# Patient Record
Sex: Female | Born: 1960 | Race: Black or African American | Hispanic: No | Smoking: Never smoker
Health system: Southern US, Community
[De-identification: ages and names within clinical notes are randomized; demographics above are authoritative.]

## PROBLEM LIST (undated history)

## (undated) DIAGNOSIS — J45909 Unspecified asthma, uncomplicated: Secondary | ICD-10-CM

## (undated) DIAGNOSIS — E079 Disorder of thyroid, unspecified: Secondary | ICD-10-CM

## (undated) DIAGNOSIS — R7303 Prediabetes: Secondary | ICD-10-CM

---

## 2015-02-27 ENCOUNTER — Ambulatory Visit
Admission: EM | Admit: 2015-02-27 | Discharge: 2015-02-27 | Disposition: A | Discharge status: Home / Self Care | Payer: Worker's Compensation

## 2015-02-27 NOTE — ED Notes (Signed)
For random drug test-works at [employer]. Does not need to see a Provider-no personal injury

## 2015-02-27 NOTE — ED Notes (Signed)
COC Urine obtained per protocol.

## 2015-09-08 ENCOUNTER — Emergency Department
Admission: EM | Admit: 2015-09-08 | Discharge: 2015-09-08 | Disposition: A | Discharge status: Home / Self Care | Payer: 59 | Attending: Emergency Medicine | Admitting: Emergency Medicine

## 2015-09-08 ENCOUNTER — Encounter: Payer: Self-pay | Admitting: Emergency Medicine

## 2015-09-08 ENCOUNTER — Emergency Department: Payer: 59

## 2015-09-08 DIAGNOSIS — R112 Nausea with vomiting, unspecified: Secondary | ICD-10-CM | POA: Insufficient documentation

## 2015-09-08 DIAGNOSIS — R51 Headache: Secondary | ICD-10-CM | POA: Insufficient documentation

## 2015-09-08 DIAGNOSIS — R519 Headache, unspecified: Secondary | ICD-10-CM

## 2015-09-08 DIAGNOSIS — J45909 Unspecified asthma, uncomplicated: Secondary | ICD-10-CM | POA: Insufficient documentation

## 2015-09-08 HISTORY — DX: Disorder of thyroid, unspecified: E07.9

## 2015-09-08 HISTORY — DX: Prediabetes: R73.03

## 2015-09-08 HISTORY — DX: Unspecified asthma, uncomplicated: J45.909

## 2015-09-08 LAB — CBC WITH DIFFERENTIAL/PLATELET
BASOS PCT: 1 %
Basophils Absolute: 0 10*3/uL (ref 0–0.1)
Eosinophils Absolute: 0.1 10*3/uL (ref 0–0.7)
Eosinophils Relative: 2 %
HEMATOCRIT: 37.1 % (ref 35.0–47.0)
HEMOGLOBIN: 11.7 g/dL — AB (ref 12.0–16.0)
LYMPHS ABS: 1.6 10*3/uL (ref 1.0–3.6)
Lymphocytes Relative: 28 %
MCH: 22.1 pg — AB (ref 26.0–34.0)
MCHC: 31.5 g/dL — AB (ref 32.0–36.0)
MCV: 70 fL — ABNORMAL LOW (ref 80.0–100.0)
MONOS PCT: 11 %
Monocytes Absolute: 0.6 10*3/uL (ref 0.2–0.9)
NEUTROS ABS: 3.3 10*3/uL (ref 1.4–6.5)
NEUTROS PCT: 58 %
Platelets: 209 10*3/uL (ref 150–440)
RBC: 5.29 MIL/uL — AB (ref 3.80–5.20)
RDW: 15.1 % — ABNORMAL HIGH (ref 11.5–14.5)
WBC: 5.7 10*3/uL (ref 3.6–11.0)

## 2015-09-08 LAB — BASIC METABOLIC PANEL
Anion gap: 4 — ABNORMAL LOW (ref 5–15)
BUN: 13 mg/dL (ref 6–20)
CHLORIDE: 107 mmol/L (ref 101–111)
CO2: 30 mmol/L (ref 22–32)
CREATININE: 0.86 mg/dL (ref 0.44–1.00)
Calcium: 9.1 mg/dL (ref 8.9–10.3)
GFR calc non Af Amer: >60 mL/min (ref >60)
Glucose, Bld: 102 mg/dL — ABNORMAL HIGH (ref 65–99)
POTASSIUM: 4.3 mmol/L (ref 3.5–5.1)
SODIUM: 141 mmol/L (ref 135–145)

## 2015-09-08 LAB — SEDIMENTATION RATE: Sed Rate: 23 mm/hr (ref 0–30)

## 2015-09-08 MED ORDER — BUTALBITAL-APAP-CAFFEINE 50-325-40 MG PO TABS: 1.0000 | ORAL_TABLET | Freq: Four times a day (QID) | ORAL | 0 refills | Status: AC | PRN

## 2015-09-08 NOTE — ED Notes (Signed)

## 2015-09-08 NOTE — ED Triage Notes (Signed)
Patient from home via POV with hx migraines. Presents today with headache across her entire head. +photosensitivity, +N/V

## 2015-09-08 NOTE — ED Provider Notes (Signed)
[facility] Emergency Department Provider Note   ____________________________________________   First MD Initiated Contact with Patient 09/08/15 1006     (approximate)  I have reviewed the triage vital signs and the nursing notes.   HISTORY  Chief Complaint Migraine   HPI Beverly Huber is a 55 y.o. female with a history of migraine headache who is presenting to the emergency department todaywith a diffuse headache which is a 5 out of 10 at this time. She says that she has had migraines in the past but not since about 10 years ago. Says that about a month ago she had a similar headache and had to lie down at work but the headache went away after resting. She also says that she had sensitivity to light this morning as well as nausea and vomiting. She took 2 Goody powders as well as Reglan and she says her pain is greatly improved. She says that at the beginning of her headache during the night her pain was a 3 out of 10 which increased then to an 8-9 out of 10. There was no sudden onset or thunderclap quality to this headache.  Patient is concerned because she says her brother had a "blood clot" in his brain. Past Medical History:  Diagnosis Date  . Asthma   . Prediabetes   . Thyroid disease     There are no active problems to display for this patient.   History reviewed. No pertinent surgical history.  Prior to Admission medications   Not on File    Allergies Review of patient's allergies indicates no known allergies.  History reviewed. No pertinent family history.  Social History Social History  Substance Use Topics  . Smoking status: Never Smoker  . Smokeless tobacco: Never Used  . Alcohol use Not on file    Review of Systems Constitutional: No fever/chills Eyes: No visual changes. ENT: No sore throat. Cardiovascular: Denies chest pain. Respiratory: Denies shortness of breath. Gastrointestinal: No abdominal pain.   No diarrhea.  No  constipation. Genitourinary: Negative for dysuria. Musculoskeletal: Negative for back pain. Skin: Negative for rash. Neurological: Negative for focal weakness or numbness.  10-point ROS otherwise negative.  ____________________________________________   PHYSICAL EXAM:  VITAL SIGNS: ED Triage Vitals  Enc Vitals Group     BP 09/08/15 0950 (!) 135/101     Pulse Rate 09/08/15 0950 64     Resp 09/08/15 0950 18     Temp 09/08/15 0950 97.8 F (36.6 C)     Temp Source 09/08/15 0950 Oral     SpO2 09/08/15 0950 100 %     Weight 09/08/15 0951 196 lb (88.9 kg)     Height 09/08/15 0951 '5\' 3"'$  (1.6 m)     Head Circumference --      Peak Flow --      Pain Score 09/08/15 0951 7     Pain Loc --      Pain Edu? --      Excl. in [place]? --     Constitutional: Alert and oriented. Well appearing and in no acute distress. Eyes: Conjunctivae are normal. PERRL. EOMI. Head: Atraumatic. Nose: No congestion/rhinnorhea. Mouth/Throat: Mucous membranes are moist.  Neck: No stridor.   Cardiovascular: Normal rate, regular rhythm. Grossly normal heart sounds.   Respiratory: Normal respiratory effort.  No retractions. Lungs CTAB. Gastrointestinal: Soft and nontender. No distention.  Musculoskeletal: No lower extremity tenderness nor edema.  No joint effusions. Neurologic:  Normal speech and language. No gross  focal neurologic deficits are appreciated. Tenderness diffusely to the patient's head including of the temples but without any nodularity along the distributions of the temporal arteries. Skin:  Skin is warm, dry and intact. No rash noted. Psychiatric: Mood and affect are normal. Speech and behavior are normal.  ____________________________________________   LABS (all labs ordered are listed, but only abnormal results are displayed)  Labs Reviewed  CBC WITH DIFFERENTIAL/PLATELET - Abnormal; Notable for the following:       Result Value   RBC 5.29 (*)    Hemoglobin 11.7 (*)    MCV 70.0 (*)     MCH 22.1 (*)    MCHC 31.5 (*)    RDW 15.1 (*)    All other components within normal limits  BASIC METABOLIC PANEL - Abnormal; Notable for the following:    Glucose, Bld 102 (*)    Anion gap 4 (*)    All other components within normal limits  SEDIMENTATION RATE   ____________________________________________  EKG   ____________________________________________  RADIOLOGY  CT Head Wo Contrast (Accession [id number]) (Order [id number])  Imaging  Date: 09/08/2015 Department: [facility] EMERGENCY DEPARTMENT Released By/Authorizing: Orbie Pyo, MD (auto-released)  PACS Images   Show images for CT Head Wo Contrast  Study Result   CLINICAL DATA:  Migraine headaches today.  Nausea and vomiting.  EXAM: CT HEAD WITHOUT CONTRAST  TECHNIQUE: Contiguous axial images were obtained from the base of the skull through the vertex without intravenous contrast.  COMPARISON:  None.  FINDINGS: Brain: There is no midline shift, hydrocephalus, or mass. No acute hemorrhage or acute transcortical infarct is identified.  Skull: No acute abnormality identified.  Sinuses/Orbits: The orbits are normal. The visualized sinuses are clear.  Other: None  IMPRESSION: No focal acute intracranial abnormality identified.   Electronically Signed   By: Abelardo Diesel M.D.   On: 09/08/2015 11:22     ____________________________________________   PROCEDURES  Procedure(s) performed:   Procedures  Critical Care performed:   ____________________________________________   INITIAL IMPRESSION / ASSESSMENT AND PLAN / ED COURSE  Pertinent labs & imaging results that were available during my care of the patient were reviewed by me and considered in my medical decision making (see chart for details).  ----------------------------------------- 12:50 PM on 09/08/2015 -----------------------------------------  Very reassuring workup including a  negative CAT scan as well as normal ESR. Patient likely with tension headache versus migraine. We'll discharge her home with Fioricet. To follow-up with her family doctor in [place].  Clinical Course     ____________________________________________   FINAL CLINICAL IMPRESSION(S) / ED DIAGNOSES  Headache.    NEW MEDICATIONS STARTED DURING THIS VISIT:  New Prescriptions   No medications on file     Note:  This document was prepared using Dragon voice recognition software and may include unintentional dictation errors.    Orbie Pyo, MD 09/08/15 1250

## 2015-11-14 ENCOUNTER — Encounter: Payer: Self-pay | Admitting: *Deleted

## 2015-11-14 ENCOUNTER — Emergency Department
Admission: EM | Admit: 2015-11-14 | Discharge: 2015-11-14 | Disposition: A | Discharge status: Home / Self Care | Payer: 59 | Attending: Emergency Medicine | Admitting: Emergency Medicine

## 2015-11-14 ENCOUNTER — Emergency Department: Payer: 59

## 2015-11-14 DIAGNOSIS — J45909 Unspecified asthma, uncomplicated: Secondary | ICD-10-CM | POA: Insufficient documentation

## 2015-11-14 DIAGNOSIS — X501XXA Overexertion from prolonged static or awkward postures, initial encounter: Secondary | ICD-10-CM | POA: Insufficient documentation

## 2015-11-14 DIAGNOSIS — Y999 Unspecified external cause status: Secondary | ICD-10-CM | POA: Diagnosis not present

## 2015-11-14 DIAGNOSIS — S93401A Sprain of unspecified ligament of right ankle, initial encounter: Secondary | ICD-10-CM | POA: Insufficient documentation

## 2015-11-14 DIAGNOSIS — Y939 Activity, unspecified: Secondary | ICD-10-CM | POA: Insufficient documentation

## 2015-11-14 DIAGNOSIS — Y92480 Sidewalk as the place of occurrence of the external cause: Secondary | ICD-10-CM | POA: Insufficient documentation

## 2015-11-14 DIAGNOSIS — S99911A Unspecified injury of right ankle, initial encounter: Secondary | ICD-10-CM | POA: Diagnosis present

## 2015-11-14 MED ORDER — IBUPROFEN 800 MG PO TABS: 800.0000 mg | ORAL_TABLET | Freq: Three times a day (TID) | ORAL | 0 refills | Status: DC | PRN

## 2015-11-14 MED ORDER — ACETAMINOPHEN 500 MG PO TABS
1000.0000 mg | ORAL_TABLET | Freq: Once | ORAL | Status: AC
  Administered 2015-11-14: 1000 mg via ORAL
  Filled 2015-11-14: qty 2

## 2015-11-14 NOTE — Discharge Instructions (Signed)
Please follow the RICE instructions to decrease pain and speed up healing. You may take Tylenol and Motrin for pain.  Return to the emergency department if you develop severe pain, numbness tingling or weakness, or any other symptoms concerning to you.

## 2015-11-14 NOTE — ED Provider Notes (Signed)
[facility] Emergency Department Provider Note  ____________________________________________  Time seen: Approximately 7:42 PM  I have reviewed the triage vital signs and the nursing notes.   HISTORY  Chief Complaint Ankle Pain    HPI Rodman KeyJanice Vitullo is a 55 y.o. female with obesity presenting for right ankle pain. The patient reports that she heard at her right ankle on the edge of a sidewalk and has been having pain since then. She did not fall, has no pain in the right knee or hip, and has been able to ambulate on the foot. No numbness or tingling.   Past Medical History:  Diagnosis Date  . Asthma   . Prediabetes   . Thyroid disease     There are no active problems to display for this patient.   History reviewed. No pertinent surgical history.  Current Outpatient Rx  . Order #: [id number] Class: Print  . Order #: [id number] Class: Print    Allergies Review of patient's allergies indicates no known allergies.  History reviewed. No pertinent family history.  Social History Social History  Substance Use Topics  . Smoking status: Never Smoker  . Smokeless tobacco: Never Used  . Alcohol use Not on file    Review of Systems Constitutional: No fever/chills.No fall. No loss of consciousness. Eyes: No visual changes. ENT: No sore throat. No congestion or rhinorrhea. Cardiovascular: Denies chest pain. Denies palpitations. Respiratory: Denies shortness of breath.  No cough. Gastrointestinal: No nausea, no vomiting.   Musculoskeletal: Negative for back pain. Positive for right ankle pain. Skin: Negative for rash. Neurological: Negative for headaches. No focal numbness, tingling or weakness.   10-point ROS otherwise negative.  ____________________________________________   PHYSICAL EXAM:  VITAL SIGNS: ED Triage Vitals [11/14/15 1741]  Enc Vitals Group     BP (!) 135/93     Pulse Rate 69     Resp 18     Temp 98.6 F (37 C)     Temp  Source Oral     SpO2 99 %     Weight 200 lb (90.7 kg)     Height 5\' 3"  (1.6 m)     Head Circumference      Peak Flow      Pain Score 5     Pain Loc      Pain Edu?      Excl. in [place]?     Constitutional: Alert and oriented. Well appearing and in no acute distress. Answers questions appropriately. Eyes: Conjunctivae are normal.  EOMI. No scleral icterus. Head: Atraumatic. Nose: No congestion/rhinnorhea. Mouth/Throat: Mucous membranes are moist.  Neck: No stridor.  Supple.   Cardiovascular: Normal rate Respiratory: Normal respiratory effort.  Musculoskeletal: Full range of motion of the right hip and knee without pain. No knee effusion. Right ankle has tenderness to palpation above and below the lateral malleolus with mild soft tissue swelling but no ecchymosis or skin break. Normal DP and PT pulses on the right. Normal sensation to light touch on the right. 5 out of 5 dorsiflexion and plantar flexion on the right. Neurologic:  A&Ox3.  Speech is clear.  Face and smile are symmetric.  EOMI.  Moves all extremities well. Skin:  Skin is warm, dry and intact. No rash noted. Psychiatric: Mood and affect are normal. Speech and behavior are normal.  Normal judgement.  ____________________________________________   LABS (all labs ordered are listed, but only abnormal results are displayed)  Labs Reviewed - No data to display ____________________________________________  EKG  Not  indicated ____________________________________________  RADIOLOGY  Dg Ankle Complete Right  Result Date: 11/14/2015 CLINICAL DATA:  Initial evaluation for acute injury, twisted ankle. EXAM: RIGHT ANKLE - COMPLETE 3+ VIEW COMPARISON:  None. FINDINGS: No acute fracture or dislocation. Ankle mortise approximated. Talar dome intact. Small posterior plantar calcaneal enthesophytes noted. Osseous mineralization normal. Mild soft tissue swelling at the right lateral malleolus. IMPRESSION: 1. No acute fracture or  dislocation. 2. Mild focal soft tissue swelling at the lateral malleolus. Electronically Signed   By: Rise MuBenjamin  McClintock M.D.   On: 11/14/2015 19:26    ____________________________________________   PROCEDURES  Procedure(s) performed: None  Procedures  Critical Care performed: No ____________________________________________   INITIAL IMPRESSION / ASSESSMENT AND PLAN / ED COURSE  Pertinent labs & imaging results that were available during my care of the patient were reviewed by me and considered in my medical decision making (see chart for details).  55 y.o. female status post eversion of the right ankle with lateral malleolar pain. Will rule out fracture, but I am more suspicious of a sprain given that she is able to relate on the ankle.  ----------------------------------------- 7:45 PM on 11/14/2015 -----------------------------------------  X-ray was negative for any osseous injury. I will send the patient home with instructions for rice, and pain control. Return precautions as well as follow-up instructions were discussed.  ____________________________________________  FINAL CLINICAL IMPRESSION(S) / ED DIAGNOSES  Final diagnoses:  Sprain of right ankle, unspecified ligament, initial encounter    Clinical Course      NEW MEDICATIONS STARTED DURING THIS VISIT:  New Prescriptions   IBUPROFEN (ADVIL,MOTRIN) 800 MG TABLET    Take 1 tablet (800 mg total) by mouth every 8 (eight) hours as needed.      Rockne MenghiniAnne-Caroline Kallon Caylor, MD 11/14/15 1946

## 2015-11-14 NOTE — ED Triage Notes (Signed)
States she twisted off her sidewalk wrong and is now having right ankle pain

## 2016-07-30 ENCOUNTER — Emergency Department
Admission: EM | Admit: 2016-07-30 | Discharge: 2016-07-30 | Disposition: A | Discharge status: Home / Self Care | Payer: 59 | Attending: Emergency Medicine | Admitting: Emergency Medicine

## 2016-07-30 ENCOUNTER — Emergency Department: Payer: 59

## 2016-07-30 DIAGNOSIS — R05 Cough: Secondary | ICD-10-CM | POA: Diagnosis present

## 2016-07-30 DIAGNOSIS — J45901 Unspecified asthma with (acute) exacerbation: Secondary | ICD-10-CM | POA: Diagnosis not present

## 2016-07-30 MED ORDER — ALBUTEROL SULFATE HFA 108 (90 BASE) MCG/ACT IN AERS: 2.0000 | INHALATION_SPRAY | Freq: Four times a day (QID) | RESPIRATORY_TRACT | 2 refills | Status: AC | PRN

## 2016-07-30 MED ORDER — IPRATROPIUM-ALBUTEROL 0.5-2.5 (3) MG/3ML IN SOLN
3.0000 mL | Freq: Once | RESPIRATORY_TRACT | Status: AC
  Administered 2016-07-30: 3 mL via RESPIRATORY_TRACT
  Filled 2016-07-30: qty 3

## 2016-07-30 NOTE — ED Triage Notes (Signed)
Pt reports having some asthma exacerbation along with a cough recently.  Pt reports being out of her rescue inhaler at this time and has been unable to get in with her PCP.  Pt is in NAD, ambulatory to triage, A&Ox4.

## 2016-07-30 NOTE — ED Provider Notes (Signed)
[facility] Emergency Department Provider Note  ____________________________________________  Time seen: Approximately 7:26 PM  I have reviewed the triage vital signs and the nursing notes.   HISTORY  Chief Complaint Asthma   HPI Beverly Huber is a 56 y.o. female who presents to the emergency department for evaluation of asthma. She is out of her albuterol inhaler. She feel that her chest is tight and has a cough that started on Monday. She denies fever or other symptoms of concern.    Past Medical History:  Diagnosis Date  . Asthma   . Prediabetes   . Thyroid disease     There are no active problems to display for this patient.   History reviewed. No pertinent surgical history.  Prior to Admission medications   Medication Sig Start Date End Date Taking? Authorizing Provider  albuterol (PROVENTIL HFA;VENTOLIN HFA) 108 (90 Base) MCG/ACT inhaler Inhale 2 puffs into the lungs every 6 (six) hours as needed for wheezing or shortness of breath. 07/30/16   Chinita Pester, FNP  butalbital-acetaminophen-caffeine (FIORICET) [phone] MG tablet Take 1-2 tablets by mouth every 6 (six) hours as needed for headache. 09/08/15 09/07/16  Myrna Blazer, MD  ibuprofen (ADVIL,MOTRIN) 800 MG tablet Take 1 tablet (800 mg total) by mouth every 8 (eight) hours as needed. 11/14/15   Rockne Menghini, MD    Allergies Patient has no known allergies.  No family history on file.  Social History Social History  Substance Use Topics  . Smoking status: Never Smoker  . Smokeless tobacco: Never Used  . Alcohol use Not on file    Review of Systems Constitutional: Negative for fever/chills ENT: Negative for sore throat. Cardiovascular: Denies chest pain. Respiratory: Positive for shortness of breath. Positive for cough. Gastrointestinal: Negative for nausea,  Negative for vomiting.  Negative for diarrhea.  Musculoskeletal: Negataive for body aches Skin:  Negative for rash. Neurological: Negative for headaches ____________________________________________   PHYSICAL EXAM:  VITAL SIGNS: ED Triage Vitals  Enc Vitals Group     BP 07/30/16 1835 (!) 146/98     Pulse Rate 07/30/16 1835 89     Resp 07/30/16 1835 18     Temp 07/30/16 1835 99.6 F (37.6 C)     Temp Source 07/30/16 1835 Oral     SpO2 07/30/16 1835 100 %     Weight 07/30/16 1836 200 lb (90.7 kg)     Height 07/30/16 1836 5' 3.5" (1.613 m)     Head Circumference --      Peak Flow --      Pain Score --      Pain Loc --      Pain Edu? --      Excl. in [place]? --     Constitutional: Alert and oriented. Well appearing and in no acute distress. Eyes: Conjunctivae are normal. EOMI. Nose: No congestion noted; no rhinnorhea. Mouth/Throat: Mucous membranes are moist.  Oropharynx normal. Tonsils without exudate. Neck: No stridor.  Lymphatic: No cervical lymphadenopathy. Cardiovascular: Normal rate, regular rhythm. Good peripheral circulation. Respiratory: Normal respiratory effort.  No retractions. Breath sounds diminished throughout. Gastrointestinal: Soft and nontender.  Musculoskeletal: FROM x 4 extremities.  Neurologic:  Normal speech and language.  Skin:  Skin is warm, dry and intact. No rash noted. Psychiatric: Mood and affect are normal. Speech and behavior are normal.  ____________________________________________   LABS (all labs ordered are listed, but only abnormal results are displayed)  Labs Reviewed - No data to display ____________________________________________  EKG  Not indicated. ____________________________________________  RADIOLOGY  Chest x-ray negative for acute cardiopulmonary abnormality per radiology. ____________________________________________   PROCEDURES  Procedure(s) performed: None  Critical Care performed: No ____________________________________________   INITIAL IMPRESSION / ASSESSMENT AND PLAN / ED COURSE  56 year old female  presenting to the emergency department for treatment and evaluation of cough. She has been out of her albuterol for several weeks. Monday, she developed a scratchy throat and has since developed a cough that has not been relieved with over-the-counter medications. She has been unable to schedule an appointment with her primary care provider for refill of her albuterol. Tonight, she was given a DuoNeb treatment. She will be given a prescription for albuterol and encouraged to follow up with the primary care provider for symptoms that are not improving over the next few days. She was encouraged to return to the emergency department for symptoms that change or worsen if she is unable to schedule an appointment.  Pertinent labs & imaging results that were available during my care of the patient were reviewed by me and considered in my medical decision making (see chart for details).  New Prescriptions   ALBUTEROL (PROVENTIL HFA;VENTOLIN HFA) 108 (90 BASE) MCG/ACT INHALER    Inhale 2 puffs into the lungs every 6 (six) hours as needed for wheezing or shortness of breath.    If controlled substance prescribed during this visit, 12 month history viewed on the [facility] prior to issuing an initial prescription for Schedule II or III opiod. ____________________________________________   FINAL CLINICAL IMPRESSION(S) / ED DIAGNOSES  Final diagnoses:  Mild asthma with exacerbation, unspecified whether persistent    Note:  This document was prepared using Dragon voice recognition software and may include unintentional dictation errors.     Chinita Pesterriplett, Adisson Deak B, FNP 07/30/16 2025    Minna AntisPaduchowski, Kevin, MD 07/30/16 2251

## 2016-07-30 NOTE — Discharge Instructions (Signed)
Please follow up with their primary care provider for symptoms that are not improving over the next couple of days. Return to the emergency department for symptoms that change or worsen if you're unable to schedule an appointment.

## 2017-01-17 ENCOUNTER — Emergency Department: Payer: 59

## 2017-01-17 ENCOUNTER — Emergency Department
Admission: EM | Admit: 2017-01-17 | Discharge: 2017-01-17 | Disposition: A | Discharge status: Home / Self Care | Payer: 59 | Attending: Emergency Medicine | Admitting: Emergency Medicine

## 2017-01-17 ENCOUNTER — Other Ambulatory Visit: Payer: Self-pay

## 2017-01-17 DIAGNOSIS — Y999 Unspecified external cause status: Secondary | ICD-10-CM | POA: Insufficient documentation

## 2017-01-17 DIAGNOSIS — S9031XA Contusion of right foot, initial encounter: Secondary | ICD-10-CM

## 2017-01-17 DIAGNOSIS — J45909 Unspecified asthma, uncomplicated: Secondary | ICD-10-CM | POA: Insufficient documentation

## 2017-01-17 DIAGNOSIS — W2203XA Walked into furniture, initial encounter: Secondary | ICD-10-CM | POA: Diagnosis not present

## 2017-01-17 DIAGNOSIS — Y929 Unspecified place or not applicable: Secondary | ICD-10-CM | POA: Insufficient documentation

## 2017-01-17 DIAGNOSIS — Z79899 Other long term (current) drug therapy: Secondary | ICD-10-CM | POA: Diagnosis not present

## 2017-01-17 DIAGNOSIS — S99921A Unspecified injury of right foot, initial encounter: Secondary | ICD-10-CM | POA: Diagnosis present

## 2017-01-17 DIAGNOSIS — Y939 Activity, unspecified: Secondary | ICD-10-CM | POA: Diagnosis not present

## 2017-01-17 MED ORDER — IBUPROFEN 600 MG PO TABS: 600.0000 mg | ORAL_TABLET | Freq: Three times a day (TID) | ORAL | 0 refills | Status: DC | PRN

## 2017-01-17 NOTE — ED Provider Notes (Signed)
[facility] Emergency Department Provider Note  ____________________________________________   First MD Initiated Contact with Patient 01/17/17 1027     (approximate)  I have reviewed the triage vital signs and the nursing notes.   HISTORY  Chief Complaint Foot Injury   HPI Beverly Huber is a 57 y.o. female is here with  complaint of third and fourth digits right foot being painful. Patient states that she kicked at a soft on the floor and actually hit the door frame. This occurred last evening.She has not taken any over-the-counter medication. She denies any other injury.she rates her pain as 3 out of 10.   Past Medical History:  Diagnosis Date  . Asthma   . Prediabetes   . Thyroid disease     There are no active problems to display for this patient.   No past surgical history on file.  Prior to Admission medications   Medication Sig Start Date End Date Taking? Authorizing Provider  albuterol (PROVENTIL HFA;VENTOLIN HFA) 108 (90 Base) MCG/ACT inhaler Inhale 2 puffs into the lungs every 6 (six) hours as needed for wheezing or shortness of breath. 07/30/16   Triplett, Rulon Eisenmengerari B, FNP  ibuprofen (ADVIL,MOTRIN) 600 MG tablet Take 1 tablet (600 mg total) by mouth every 8 (eight) hours as needed. 01/17/17   Tommi RumpsSummers, Lavanda Nevels L, PA-C    Allergies Patient has no known allergies.  No family history on file.  Social History Social History   Tobacco Use  . Smoking status: Never Smoker  . Smokeless tobacco: Never Used  Substance Use Topics  . Alcohol use: Not on file  . Drug use: Not on file    Review of Systems Constitutional: No fever/chills Cardiovascular: Denies chest pain. Respiratory: Denies shortness of breath. Gastrointestinal:  No nausea, no vomiting.  Musculoskeletal: right foot pain. Skin: Negative for skin abrasion. Neurological: Negative for  focal weakness or numbness. ____________________________________________   PHYSICAL  EXAM:  VITAL SIGNS: ED Triage Vitals  Enc Vitals Group     BP 01/17/17 0922 137/84     Pulse Rate 01/17/17 0922 74     Resp 01/17/17 0922 20     Temp 01/17/17 0922 98.3 F (36.8 C)     Temp Source 01/17/17 0922 Oral     SpO2 01/17/17 0922 99 %     Weight 01/17/17 0922 212 lb (96.2 kg)     Height 01/17/17 0922 5' 3.5" (1.613 m)     Head Circumference --      Peak Flow --      Pain Score 01/17/17 0921 3     Pain Loc --      Pain Edu? --      Excl. in [place]? --    Constitutional: Alert and oriented. Well appearing and in no acute distress. Eyes: Conjunctivae are normal.  Head: Atraumatic. Neck: No stridor.   Cardiovascular: Normal rate, regular rhythm. Grossly normal heart sounds.  Good peripheral circulation. Respiratory: Normal respiratory effort.  No retractions. Lungs CTAB. Musculoskeletal: examination of right foot third and fourth digit is extremely tender to touch. No gross deformity is noted. There is some soft tissue swelling present. Motor sensory function intact. Neurologic:  Normal speech and language. No gross focal neurologic deficits are appreciated.  Skin:  Skin is warm, dry and intact. No rash noted. Psychiatric: Mood and affect are normal. Speech and behavior are normal.  ____________________________________________   LABS (all labs ordered are listed, but only abnormal results are displayed)  Labs Reviewed -  No data to display  RADIOLOGY  Dg Foot Complete Right  Result Date: 01/17/2017 CLINICAL DATA:  Patient kicked door frame.  Initial encounter. EXAM: RIGHT FOOT COMPLETE - 3+ VIEW COMPARISON:  Ankle radiograph 11/14/2015 FINDINGS: Normal anatomic alignment. No evidence for acute fracture or dislocation. Posterior calcaneal spurring. Unremarkable soft tissues. IMPRESSION: No acute osseous abnormality. Electronically Signed   By: Annia Belt M.D.   On: 01/17/2017 09:51    ____________________________________________   PROCEDURES  Procedure(s) performed:  None  Procedures  Critical Care performed: No  ____________________________________________   INITIAL IMPRESSION / ASSESSMENT AND PLAN / ED COURSE Patient was reassured that x-ray did not show a fracture. Patient was placed in a wooden shoe and given a prescription for ibuprofen 600 mg every 8 hours as needed for pain. She is aware she needs to continue to ice and elevate to reduce swelling and help with pain. She'll follow-up with Dr. Clide Cliff who is on-call for podiatry if any continued problems.  ____________________________________________   FINAL CLINICAL IMPRESSION(S) / ED DIAGNOSES  Final diagnoses:  Contusion of right foot, initial encounter     ED Discharge Orders        Ordered    ibuprofen (ADVIL,MOTRIN) 600 MG tablet  Every 8 hours PRN     01/17/17 1041       Note:  This document was prepared using Dragon voice recognition software and may include unintentional dictation errors.    Tommi Rumps, PA-C 01/17/17 1316    Governor Rooks, MD 01/17/17 [phone]

## 2017-01-17 NOTE — ED Notes (Signed)
Post op shoe on 

## 2017-01-17 NOTE — ED Triage Notes (Signed)
Pt report that her kids left a sock on the floor in the bathroom and she went to kick it and kicked the door frame. Her third and fourth digit on right foot is swollen.

## 2017-06-10 ENCOUNTER — Other Ambulatory Visit: Payer: Self-pay | Admitting: Family Medicine

## 2017-06-10 DIAGNOSIS — E010 Iodine-deficiency related diffuse (endemic) goiter: Secondary | ICD-10-CM

## 2017-06-16 ENCOUNTER — Other Ambulatory Visit: Payer: Self-pay | Admitting: Family Medicine

## 2017-06-17 ENCOUNTER — Other Ambulatory Visit: Payer: Self-pay | Admitting: Family Medicine

## 2017-06-17 DIAGNOSIS — Z1239 Encounter for other screening for malignant neoplasm of breast: Secondary | ICD-10-CM

## 2017-06-22 ENCOUNTER — Ambulatory Visit
Admission: RE | Admit: 2017-06-22 | Discharge: 2017-06-22 | Disposition: A | Discharge status: Home / Self Care | Payer: 59 | Source: Ambulatory Visit | Attending: Family Medicine | Admitting: Family Medicine

## 2017-06-22 DIAGNOSIS — E010 Iodine-deficiency related diffuse (endemic) goiter: Secondary | ICD-10-CM | POA: Insufficient documentation

## 2017-07-15 ENCOUNTER — Ambulatory Visit: Payer: Self-pay | Admitting: Dietician

## 2017-07-15 ENCOUNTER — Ambulatory Visit
Admission: RE | Admit: 2017-07-15 | Discharge: 2017-07-15 | Disposition: A | Discharge status: Home / Self Care | Payer: 59 | Source: Ambulatory Visit | Attending: Family Medicine | Admitting: Family Medicine

## 2017-07-15 DIAGNOSIS — Z1231 Encounter for screening mammogram for malignant neoplasm of breast: Secondary | ICD-10-CM | POA: Insufficient documentation

## 2017-07-15 DIAGNOSIS — Z1239 Encounter for other screening for malignant neoplasm of breast: Secondary | ICD-10-CM

## 2017-07-21 ENCOUNTER — Other Ambulatory Visit: Payer: Self-pay | Admitting: Family Medicine

## 2017-07-21 DIAGNOSIS — N6310 Unspecified lump in the right breast, unspecified quadrant: Secondary | ICD-10-CM

## 2017-07-21 DIAGNOSIS — R928 Other abnormal and inconclusive findings on diagnostic imaging of breast: Secondary | ICD-10-CM

## 2017-07-22 ENCOUNTER — Ambulatory Visit
Admission: RE | Admit: 2017-07-22 | Discharge: 2017-07-22 | Disposition: A | Discharge status: Home / Self Care | Payer: 59 | Source: Ambulatory Visit | Attending: Family Medicine | Admitting: Family Medicine

## 2017-07-22 DIAGNOSIS — R928 Other abnormal and inconclusive findings on diagnostic imaging of breast: Secondary | ICD-10-CM | POA: Diagnosis not present

## 2017-07-22 DIAGNOSIS — N6310 Unspecified lump in the right breast, unspecified quadrant: Secondary | ICD-10-CM | POA: Insufficient documentation

## 2017-08-27 ENCOUNTER — Encounter: Payer: Self-pay | Admitting: Dietician

## 2017-08-27 NOTE — Progress Notes (Signed)
Have not heard from patient to reschedule her missed appointment from 07/15/17. Sent letter to referring provider.

## 2018-09-30 ENCOUNTER — Other Ambulatory Visit: Payer: Self-pay | Admitting: Family Medicine

## 2018-09-30 DIAGNOSIS — E041 Nontoxic single thyroid nodule: Secondary | ICD-10-CM

## 2018-10-06 ENCOUNTER — Ambulatory Visit: Payer: 59

## 2019-01-16 ENCOUNTER — Emergency Department: Payer: 59

## 2019-01-16 ENCOUNTER — Observation Stay
Admission: EM | Admit: 2019-01-16 | Discharge: 2019-01-18 | Disposition: A | Discharge status: Home / Self Care | Payer: 59 | Attending: Family Medicine | Admitting: Family Medicine

## 2019-01-16 ENCOUNTER — Other Ambulatory Visit: Payer: Self-pay

## 2019-01-16 ENCOUNTER — Encounter: Payer: Self-pay | Admitting: Internal Medicine

## 2019-01-16 DIAGNOSIS — E039 Hypothyroidism, unspecified: Secondary | ICD-10-CM | POA: Diagnosis not present

## 2019-01-16 DIAGNOSIS — L03116 Cellulitis of left lower limb: Secondary | ICD-10-CM | POA: Diagnosis not present

## 2019-01-16 DIAGNOSIS — J45909 Unspecified asthma, uncomplicated: Secondary | ICD-10-CM | POA: Diagnosis not present

## 2019-01-16 DIAGNOSIS — Z23 Encounter for immunization: Secondary | ICD-10-CM | POA: Insufficient documentation

## 2019-01-16 DIAGNOSIS — S90852A Superficial foreign body, left foot, initial encounter: Secondary | ICD-10-CM | POA: Insufficient documentation

## 2019-01-16 DIAGNOSIS — Z20822 Contact with and (suspected) exposure to covid-19: Secondary | ICD-10-CM | POA: Diagnosis not present

## 2019-01-16 DIAGNOSIS — L0390 Cellulitis, unspecified: Secondary | ICD-10-CM | POA: Diagnosis present

## 2019-01-16 DIAGNOSIS — M795 Residual foreign body in soft tissue: Secondary | ICD-10-CM

## 2019-01-16 DIAGNOSIS — Z7984 Long term (current) use of oral hypoglycemic drugs: Secondary | ICD-10-CM | POA: Diagnosis not present

## 2019-01-16 DIAGNOSIS — R7303 Prediabetes: Secondary | ICD-10-CM | POA: Diagnosis not present

## 2019-01-16 DIAGNOSIS — Z79890 Hormone replacement therapy (postmenopausal): Secondary | ICD-10-CM | POA: Diagnosis not present

## 2019-01-16 DIAGNOSIS — L03213 Periorbital cellulitis: Secondary | ICD-10-CM

## 2019-01-16 DIAGNOSIS — W458XXA Other foreign body or object entering through skin, initial encounter: Secondary | ICD-10-CM | POA: Diagnosis not present

## 2019-01-16 DIAGNOSIS — T148XXA Other injury of unspecified body region, initial encounter: Secondary | ICD-10-CM

## 2019-01-16 LAB — CBC WITH DIFFERENTIAL/PLATELET
Abs Immature Granulocytes: 0.03 10*3/uL (ref 0.00–0.07)
Basophils Absolute: 0 10*3/uL (ref 0.0–0.1)
Basophils Relative: 0 %
Eosinophils Absolute: 0.1 10*3/uL (ref 0.0–0.5)
Eosinophils Relative: 1 %
HCT: 40 % (ref 36.0–46.0)
Hemoglobin: 11.8 g/dL — ABNORMAL LOW (ref 12.0–15.0)
Immature Granulocytes: 0 %
Lymphocytes Relative: 32 %
Lymphs Abs: 2.5 10*3/uL (ref 0.7–4.0)
MCH: 21.7 pg — ABNORMAL LOW (ref 26.0–34.0)
MCHC: 29.5 g/dL — ABNORMAL LOW (ref 30.0–36.0)
MCV: 73.5 fL — ABNORMAL LOW (ref 80.0–100.0)
Monocytes Absolute: 0.5 10*3/uL (ref 0.1–1.0)
Monocytes Relative: 7 %
Neutro Abs: 4.8 10*3/uL (ref 1.7–7.7)
Neutrophils Relative %: 60 %
Platelets: 230 10*3/uL (ref 150–400)
RBC: 5.44 MIL/uL — ABNORMAL HIGH (ref 3.87–5.11)
RDW: 15.3 % (ref 11.5–15.5)
WBC: 8 10*3/uL (ref 4.0–10.5)
nRBC: 0 % (ref 0.0–0.2)

## 2019-01-16 LAB — COMPREHENSIVE METABOLIC PANEL
ALT: 20 U/L (ref 0–44)
AST: 17 U/L (ref 15–41)
Albumin: 4.3 g/dL (ref 3.5–5.0)
Alkaline Phosphatase: 74 U/L (ref 38–126)
Anion gap: 9 (ref 5–15)
BUN: 10 mg/dL (ref 6–20)
CO2: 27 mmol/L (ref 22–32)
Calcium: 9.4 mg/dL (ref 8.9–10.3)
Chloride: 104 mmol/L (ref 98–111)
Creatinine, Ser: 0.82 mg/dL (ref 0.44–1.00)
GFR calc Af Amer: >60 mL/min (ref >60)
GFR calc non Af Amer: >60 mL/min (ref >60)
Glucose, Bld: 111 mg/dL — ABNORMAL HIGH (ref 70–99)
Potassium: 3.6 mmol/L (ref 3.5–5.1)
Sodium: 140 mmol/L (ref 135–145)
Total Bilirubin: 0.9 mg/dL (ref 0.3–1.2)
Total Protein: 8.5 g/dL — ABNORMAL HIGH (ref 6.5–8.1)

## 2019-01-16 MED ORDER — HYDROMORPHONE HCL 1 MG/ML IJ SOLN
0.5000 mg | INTRAMUSCULAR | Status: DC | PRN
  Administered 2019-01-17: 0.5 mg via INTRAVENOUS
  Filled 2019-01-16: qty 0.5

## 2019-01-16 MED ORDER — ACETAMINOPHEN 650 MG RE SUPP: 650.0000 mg | Freq: Four times a day (QID) | RECTAL | Status: DC | PRN

## 2019-01-16 MED ORDER — SODIUM CHLORIDE 0.9 % IV SOLN: INTRAVENOUS | Status: AC

## 2019-01-16 MED ORDER — PIPERACILLIN-TAZOBACTAM 3.375 G IVPB 30 MIN
3.3750 g | Freq: Once | INTRAVENOUS | Status: AC
  Administered 2019-01-16: 3.375 g via INTRAVENOUS
  Filled 2019-01-16: qty 50

## 2019-01-16 MED ORDER — PIPERACILLIN-TAZOBACTAM 3.375 G IVPB
3.3750 g | Freq: Three times a day (TID) | INTRAVENOUS | Status: DC
  Administered 2019-01-17 – 2019-01-18 (×3): 3.375 g via INTRAVENOUS
  Filled 2019-01-16 (×6): qty 50

## 2019-01-16 MED ORDER — ONDANSETRON HCL 4 MG/2ML IJ SOLN
4.0000 mg | Freq: Four times a day (QID) | INTRAMUSCULAR | Status: DC | PRN
  Administered 2019-01-18: 4 mg via INTRAVENOUS
  Filled 2019-01-16: qty 2

## 2019-01-16 MED ORDER — INSULIN ASPART 100 UNIT/ML ~~LOC~~ SOLN
0.0000 [IU] | SUBCUTANEOUS | Status: DC
  Administered 2019-01-17 – 2019-01-18 (×3): 1 [IU] via SUBCUTANEOUS
  Filled 2019-01-16 (×2): qty 1

## 2019-01-16 MED ORDER — ENOXAPARIN SODIUM 40 MG/0.4ML ~~LOC~~ SOLN
40.0000 mg | SUBCUTANEOUS | Status: DC
  Administered 2019-01-16 – 2019-01-17 (×2): 40 mg via SUBCUTANEOUS
  Filled 2019-01-16 (×2): qty 0.4

## 2019-01-16 MED ORDER — ACETAMINOPHEN 325 MG PO TABS
650.0000 mg | ORAL_TABLET | Freq: Four times a day (QID) | ORAL | Status: DC | PRN
  Administered 2019-01-16 – 2019-01-17 (×2): 650 mg via ORAL
  Filled 2019-01-16 (×2): qty 2

## 2019-01-16 MED ORDER — LIDOCAINE HCL (PF) 1 % IJ SOLN
INTRAMUSCULAR | Status: AC
  Filled 2019-01-16: qty 5

## 2019-01-16 MED ORDER — LIDOCAINE HCL (PF) 1 % IJ SOLN
5.0000 mL | Freq: Once | INTRAMUSCULAR | Status: DC
  Filled 2019-01-16: qty 5

## 2019-01-16 NOTE — ED Triage Notes (Signed)
Pt arrived via POV with reports of stepping on piece of wood last night, has possible splinter in L foot, c/o swelling to foot

## 2019-01-16 NOTE — ED Provider Notes (Signed)
[facility] Emergency Department Provider Note  ____________________________________________  Time seen: Approximately 7:32 PM  I have reviewed the triage vital signs and the nursing notes.   HISTORY  Chief Complaint Foot Pain    HPI Beverly Huber is a 59 y.o. female that presents to the emergency department for evaluation of foot foreign body yesterday.  Patient states that she stepped on a fairly large wooden splinter last night.  She was able to pull out the splinter but unsure if she got it all.  This morning she woke up and her foot was swollen and it still felt like there was a piece of splinter in her foot.   Past Medical History:  Diagnosis Date  . Asthma   . Prediabetes   . Thyroid disease     Patient Active Problem List   Diagnosis Date Noted  . Cellulitis 01/16/2019  . Asthma 01/16/2019  . Prediabetes 01/16/2019    History reviewed. No pertinent surgical history.  Prior to Admission medications   Medication Sig Start Date End Date Taking? Authorizing Provider  albuterol (PROVENTIL HFA;VENTOLIN HFA) 108 (90 Base) MCG/ACT inhaler Inhale 2 puffs into the lungs every 6 (six) hours as needed for wheezing or shortness of breath. 07/30/16  Yes Triplett, Cari B, FNP  ibuprofen (ADVIL,MOTRIN) 600 MG tablet Take 1 tablet (600 mg total) by mouth every 8 (eight) hours as needed. 01/17/17  Yes Johnn Hai, PA-C  levothyroxine (SYNTHROID) 25 MCG tablet Take 25 mcg by mouth once a week. Take on Weds with 44mcg tablet 12/06/18  Yes [provider]  levothyroxine (SYNTHROID) 50 MCG tablet Take 50 mcg by mouth every morning. 12/11/18  Yes [provider]  metFORMIN (GLUCOPHAGE) 500 MG tablet Take 500 mg by mouth every morning. 12/14/18  Yes [provider]    Allergies Patient has no known allergies.  Family History  Problem Relation Age of Onset  . Hypothyroidism Mother   . Breast cancer Neg Hx     Social  History Social History   Tobacco Use  . Smoking status: Never Smoker  . Smokeless tobacco: Never Used  Substance Use Topics  . Alcohol use: Not Currently  . Drug use: Not on file     Review of Systems  Constitutional: No fever/chills Gastrointestinal: No nausea, no vomiting.  Musculoskeletal: Positive for foot pain. Skin: Negative for abrasions, lacerations, ecchymosis. Positive for rash. Neurological: Negative for numbness or tingling   ____________________________________________   PHYSICAL EXAM:  VITAL SIGNS: ED Triage Vitals  Enc Vitals Group     BP 01/16/19 1643 (!) 155/101     Pulse Rate 01/16/19 1643 88     Resp 01/16/19 1643 18     Temp 01/16/19 1643 98.4 F (36.9 C)     Temp Source 01/16/19 1643 Oral     SpO2 01/16/19 1643 99 %     Weight 01/16/19 1642 210 lb (95.3 kg)     Height 01/16/19 1642 5' 3.5" (1.613 m)     Head Circumference --      Peak Flow --      Pain Score 01/16/19 1641 6     Pain Loc --      Pain Edu? --      Excl. in [place]? --      Constitutional: Alert and oriented. Well appearing and in no acute distress. Eyes: Conjunctivae are normal. PERRL. EOMI. Head: Atraumatic. ENT:      Ears:      Nose:  No congestion/rhinnorhea.      Mouth/Throat: Mucous membranes are moist.  Neck: No stridor.  Cardiovascular: Normal rate, regular rhythm.  Good peripheral circulation. Respiratory: Normal respiratory effort without tachypnea or retractions. Lungs CTAB. Good air entry to the bases with no decreased or absent breath sounds. Musculoskeletal: Full range of motion to all extremities. No gross deformities appreciated. Neurologic:  Normal speech and language. No gross focal neurologic deficits are appreciated.  Skin:  Skin is warm, dry. Puncture to left distal lateral plantar foot.  Erythema to distal dorsal foot that extends into midfoot and does not extend into digits. Psychiatric: Mood and affect are normal. Speech and behavior are normal. Patient  exhibits appropriate insight and judgement.   ____________________________________________   LABS (all labs ordered are listed, but only abnormal results are displayed)  Labs Reviewed  CBC WITH DIFFERENTIAL/PLATELET - Abnormal; Notable for the following components:      Result Value   RBC 5.44 (*)    Hemoglobin 11.8 (*)    MCV 73.5 (*)    MCH 21.7 (*)    MCHC 29.5 (*)    All other components within normal limits  COMPREHENSIVE METABOLIC PANEL - Abnormal; Notable for the following components:   Glucose, Bld 111 (*)    Total Protein 8.5 (*)    All other components within normal limits  RESPIRATORY PANEL BY RT PCR (FLU A&B, COVID)  HIV ANTIBODY (ROUTINE TESTING W REFLEX)  COMPREHENSIVE METABOLIC PANEL  CBC  HEMOGLOBIN A1C   ____________________________________________  EKG   ____________________________________________  RADIOLOGY [name], personally viewed and evaluated these images (plain radiographs) as part of my medical decision making, as well as reviewing the written report by the radiologist.  DG Foot Complete Left  Result Date: 01/16/2019 CLINICAL DATA:  Stepped on piece of wood last night. Possible splinter. Swelling. EXAM: LEFT FOOT - COMPLETE 3+ VIEW COMPARISON:  None. FINDINGS: No acute fracture or dislocation. No radiopaque foreign object. Suspect mild dorsal soft tissue swelling about the forefoot on the lateral view. Tiny Achilles spur. IMPRESSION: No acute osseous abnormality. Electronically Signed   By: Jeronimo Greaves M.D.   On: 01/16/2019 18:38    ____________________________________________    PROCEDURES  Procedure(s) performed:    .Foreign Body Removal  Date/Time: 01/16/2019 7:37 PM Performed by: Enid Derry, PA-C Authorized by: Enid Derry, PA-C  Consent: Verbal consent obtained. Risks and benefits: risks, benefits and alternatives were discussed Consent given by: patient Patient understanding: patient states understanding of the  procedure being performed Body area: skin General location: lower extremity Location details: left foot Localization method: ultrasound and visualized Removal mechanism: forceps and scalpel Dressing: dressing applied Depth: deep Complexity: complex 1 objects recovered. Post-procedure assessment: residual foreign bodies remain Patient tolerance: patient tolerated the procedure well with no immediate complications      Medications  lidocaine (PF) (XYLOCAINE) 1 % injection 5 mL (5 mLs Intradermal Not Given 01/16/19 2216)  enoxaparin (LOVENOX) injection 40 mg (40 mg Subcutaneous Given 01/16/19 2222)  acetaminophen (TYLENOL) tablet 650 mg (650 mg Oral Given 01/16/19 2220)    Or  acetaminophen (TYLENOL) suppository 650 mg ( Rectal See Alternative 01/16/19 2220)  0.9 %  sodium chloride infusion (has no administration in time range)  HYDROmorphone (DILAUDID) injection 0.5 mg (has no administration in time range)  ondansetron (ZOFRAN) injection 4 mg (has no administration in time range)  insulin aspart (novoLOG) injection 0-9 Units (has no administration in time range)  piperacillin-tazobactam (ZOSYN) IVPB 3.375 g (has no  administration in time range)  piperacillin-tazobactam (ZOSYN) IVPB 3.375 g (0 g Intravenous Stopped 01/16/19 2143)     ____________________________________________   INITIAL IMPRESSION / ASSESSMENT AND PLAN / ED COURSE  Pertinent labs & imaging results that were available during my care of the patient were reviewed by me and considered in my medical decision making (see chart for details).  Review of the [place] CSRS was performed in accordance of the [facility] prior to dispensing any controlled drugs.   Patient presented to the emergency department for evaluation of left foot foreign body.  Exam is consistent with cellulitis and retained foreign body.  Vital signs and exam are reassuring.  Small pieces of wood were removed from patient's puncture wound.  I am unable to visualize any  additional fluid, however patient feels that there is still a piece of wood retained in her foot.  I suspect that there is a deep piece of wood in her foot based on cellulitis to the dorsal aspect of her foot.  Dr. Graciela Husbands was consulted and recommend that patient be admitted for surgery tomorrow to remove splinter foreign body.  He recommends that patient be started on Zosyn for infection.  Dr. Mayford Knife is agreeable with this plan admission.  Patient is agreeable.     Beverly Huber was evaluated in Emergency Department on 01/16/2019 for the symptoms described in the history of present illness. She was evaluated in the context of the global COVID-19 pandemic, which necessitated consideration that the patient might be at risk for infection with the SARS-CoV-2 virus that causes COVID-19. Institutional protocols and algorithms that pertain to the evaluation of patients at risk for COVID-19 are in a state of rapid change based on information released by regulatory bodies including the CDC and federal and state organizations. These policies and algorithms were followed during the patient's care in the ED.   ____________________________________________  FINAL CLINICAL IMPRESSION(S) / ED DIAGNOSES  Final diagnoses:  Foreign body in skin  Cellulitis of left lower extremity      NEW MEDICATIONS STARTED DURING THIS VISIT:  ED Discharge Orders    None          This chart was dictated using voice recognition software/Dragon. Despite best efforts to proofread, errors can occur which can change the meaning. Any change was purely unintentional.    Enid Derry, PA-C 01/16/19 2233    Emily Filbert, MD 01/16/19 [phone]

## 2019-01-16 NOTE — Progress Notes (Signed)
Pharmacy Antibiotic Note  Beverly Huber is a 59 y.o. female admitted on 01/16/2019 with wound infection.  Pharmacy has been consulted for Zosyn dosing.  Plan: Zosyn 3.375g IV q8h (4 hour infusion).  Height: 5' 3.5" (161.3 cm) Weight: 210 lb (95.3 kg) IBW/kg (Calculated) : 53.55  Temp (24hrs), Avg:98.4 F (36.9 C), Min:98.4 F (36.9 C), Max:98.4 F (36.9 C)  Recent Labs  Lab 01/16/19 2108  WBC 8.0  CREATININE 0.82    Estimated Creatinine Clearance: 84 mL/min (by C-G formula based on SCr of 0.82 mg/dL).    No Known Allergies  Antimicrobials this admission:   >>    >>   Dose adjustments this admission:   Microbiology results:  BCx:   UCx:    Sputum:    MRSA PCR:   Thank you for allowing pharmacy to be a part of this patient's care.  Natividad Halls D 01/16/2019 10:30 PM

## 2019-01-16 NOTE — H&P (Addendum)
TRH H&P    Patient Demographics:    Beverly Huber, is a 59 y.o. female  MRN: [id number]  DOB - [birth year]  Admit Date - 01/16/2019  Referring MD/NP/PA:  Enid Derry  Outpatient Primary MD for the patient is Rayetta Humphrey, MD  Patient coming from:  home  Chief complaint- stepped on toothpick   HPI:    Beverly Huber  is a 59 y.o. female,  w hypothyroidism, prediabetes,  asthma, apparently presents with c/o stepping on ? Toothpick.  Pt presented to ED due to pain.   In Ed,  T 98.4, P 88 R 18, Bp 155/101 Pox 99% on RA Wt 95.3kg  L foot xray FINDINGS: No acute fracture or dislocation. No radiopaque foreign object. Suspect mild dorsal soft tissue swelling about the forefoot on the lateral view. Tiny Achilles spur. IMPRESSION: No acute osseous abnormality.  Wbc 8.0, Hgb 11.8, Plt 230 Na 140, K 3.6, Bun 10, Creatinine 0.82 Ast 17, Alt 20  ED consulted Dr. Graciela Husbands who will be by to evaluate the patient in Am to remove foreign body.   Pt will be admitted for cellulitis, foreign body.      Review of systems:    In addition to the HPI above,  No Fever-chills, No Headache, No changes with Vision or hearing, No problems swallowing food or Liquids, No Chest pain, Cough or Shortness of Breath, No Abdominal pain, No Nausea or Vomiting, bowel movements are regular, No Blood in stool or Urine, No dysuria,   No new joints pains-aches,  No new weakness, tingling, numbness in any extremity, No recent weight gain or loss, No polyuria, polydypsia or polyphagia, No significant Mental Stressors.  All other systems reviewed and are negative.    Past History of the following :    Past Medical History:  Diagnosis Date  . Asthma   . Prediabetes   . Thyroid disease       History reviewed. No pertinent surgical history. None   Social History:      Social History   Tobacco Use  . Smoking  status: Never Smoker  . Smokeless tobacco: Never Used  Substance Use Topics  . Alcohol use: Not Currently       Family History :     Family History  Problem Relation Age of Onset  . Hypothyroidism Mother   . Breast cancer Neg Hx      Home Medications:   Prior to Admission medications   Medication Sig Start Date End Date Taking? Authorizing Provider  albuterol (PROVENTIL HFA;VENTOLIN HFA) 108 (90 Base) MCG/ACT inhaler Inhale 2 puffs into the lungs every 6 (six) hours as needed for wheezing or shortness of breath. 07/30/16  Yes Triplett, Cari B, FNP  ibuprofen (ADVIL,MOTRIN) 600 MG tablet Take 1 tablet (600 mg total) by mouth every 8 (eight) hours as needed. 01/17/17  Yes Tommi Rumps, PA-C  levothyroxine (SYNTHROID) 25 MCG tablet Take 25 mcg by mouth once a week. Take on Weds with tablet 12/06/18  Yes [provider]  levothyroxine (SYNTHROID)  50 MCG tablet Take 50 mcg by mouth every morning. 12/11/18  Yes [provider]  metFORMIN (GLUCOPHAGE) 500 MG tablet Take 500 mg by mouth every morning. 12/14/18  Yes [provider]     Allergies:    No Known Allergies   Physical Exam:   Vitals  Blood pressure (!) 155/101, pulse 88, temperature 98.4 F (36.9 C), temperature source Oral, resp. rate 18, height 5' 3.5" (1.613 m), weight 95.3 kg, SpO2 99 %.  1.  General: axoxo3  2. Psychiatric: euthymic  3. Neurologic: nonfocal  4. HEENMT:  Anicteric, pupils 1.78mm symmetric, direct, consensual intact Neck: no jvd  5. Respiratory : CTAB  6. Cardiovascular : rrr s1, s2, no m/g/r  7. Gastrointestinal:  ABd: soft, nt, nd, +bs  8. Skin:  Ext: no c/c,  Slight redness and swelling just below the 2nd mtp area left foot.  Also evidence of puncture mark  9.Musculoskeletal:  Good ROM    Data Review:    CBC Recent Labs  Lab 01/16/19 2108  WBC 8.0  HGB 11.8*  HCT 40.0  PLT 230  MCV 73.5*  MCH 21.7*  MCHC 29.5*  RDW 15.3    LYMPHSABS 2.5  MONOABS 0.5  EOSABS 0.1  BASOSABS 0.0   ------------------------------------------------------------------------------------------------------------------  Results for orders placed or performed during the hospital encounter of 01/16/19 (from the past 48 hour(s))  CBC with Differential     Status: Abnormal   Collection Time: 01/16/19  9:08 PM  Result Value Ref Range   WBC 8.0 4.0 - 10.5 K/uL   RBC 5.44 (H) 3.87 - 5.11 MIL/uL   Hemoglobin 11.8 (L) 12.0 - 15.0 g/dL   HCT 40.0 36.0 - 46.0 %   MCV 73.5 (L) 80.0 - 100.0 fL   MCH 21.7 (L) 26.0 - 34.0 pg   MCHC 29.5 (L) 30.0 - 36.0 g/dL   RDW 15.3 11.5 - 15.5 %   Platelets 230 150 - 400 K/uL   nRBC 0.0 0.0 - 0.2 %   Neutrophils Relative % 60 %   Neutro Abs 4.8 1.7 - 7.7 K/uL   Lymphocytes Relative 32 %   Lymphs Abs 2.5 0.7 - 4.0 K/uL   Monocytes Relative 7 %   Monocytes Absolute 0.5 0.1 - 1.0 K/uL   Eosinophils Relative 1 %   Eosinophils Absolute 0.1 0.0 - 0.5 K/uL   Basophils Relative 0 %   Basophils Absolute 0.0 0.0 - 0.1 K/uL   Immature Granulocytes 0 %   Abs Immature Granulocytes 0.03 0.00 - 0.07 K/uL    Comment: Performed at [facility], [address] [place], [place] [address]  Comprehensive metabolic panel     Status: Abnormal   Collection Time: 01/16/19  9:08 PM  Result Value Ref Range   Sodium 140 135 - 145 mmol/L   Potassium 3.6 3.5 - 5.1 mmol/L   Chloride 104 98 - 111 mmol/L   CO2 27 22 - 32 mmol/L   Glucose, Bld 111 (H) 70 - 99 mg/dL   BUN 10 6 - 20 mg/dL   Creatinine, Ser 0.82 0.44 - 1.00 mg/dL   Calcium 9.4 8.9 - 10.3 mg/dL   Total Protein 8.5 (H) 6.5 - 8.1 g/dL   Albumin 4.3 3.5 - 5.0 g/dL   AST 17 15 - 41 U/L   ALT 20 0 - 44 U/L   Alkaline Phosphatase 74 38 - 126 U/L   Total Bilirubin 0.9 0.3 - 1.2 mg/dL   GFR calc non  Af Amer >60 >60 mL/min   GFR calc Af Amer >60 >60 mL/min   Anion gap 9 5 - 15    Comment: Performed at [facility], [address]  [place], [place] [address]    Chemistries  Recent Labs  Lab 01/16/19 2108  NA 140  K 3.6  CL 104  CO2 27  GLUCOSE 111*  BUN 10  CREATININE 0.82  CALCIUM 9.4  AST 17  ALT 20  ALKPHOS 74  BILITOT 0.9   ------------------------------------------------------------------------------------------------------------------  ------------------------------------------------------------------------------------------------------------------ GFR: Estimated Creatinine Clearance: 84 mL/min (by C-G formula based on SCr of 0.82 mg/dL). Liver Function Tests: Recent Labs  Lab 01/16/19 2108  AST 17  ALT 20  ALKPHOS 74  BILITOT 0.9  PROT 8.5*  ALBUMIN 4.3   No results for input(s): LIPASE, AMYLASE in the last 168 hours. No results for input(s): AMMONIA in the last 168 hours. Coagulation Profile: No results for input(s): INR, PROTIME in the last 168 hours. Cardiac Enzymes: No results for input(s): CKTOTAL, CKMB, CKMBINDEX, TROPONINI in the last 168 hours. BNP (last 3 results) No results for input(s): PROBNP in the last 8760 hours. HbA1C: No results for input(s): HGBA1C in the last 72 hours. CBG: No results for input(s): GLUCAP in the last 168 hours. Lipid Profile: No results for input(s): CHOL, HDL, LDLCALC, TRIG, CHOLHDL, LDLDIRECT in the last 72 hours. Thyroid Function Tests: No results for input(s): TSH, T4TOTAL, FREET4, T3FREE, THYROIDAB in the last 72 hours. Anemia Panel: No results for input(s): VITAMINB12, FOLATE, FERRITIN, TIBC, IRON, RETICCTPCT in the last 72 hours.  --------------------------------------------------------------------------------------------------------------- Urine analysis: No results found for: COLORURINE, APPEARANCEUR, LABSPEC, PHURINE, GLUCOSEU, HGBUR, BILIRUBINUR, KETONESUR, PROTEINUR, UROBILINOGEN, NITRITE, LEUKOCYTESUR    Imaging Results:    DG Foot Complete Left  Result Date: 01/16/2019 CLINICAL DATA:  Stepped on piece of wood last night. Possible  splinter. Swelling. EXAM: LEFT FOOT - COMPLETE 3+ VIEW COMPARISON:  None. FINDINGS: No acute fracture or dislocation. No radiopaque foreign object. Suspect mild dorsal soft tissue swelling about the forefoot on the lateral view. Tiny Achilles spur. IMPRESSION: No acute osseous abnormality. Electronically Signed   By: Jeronimo Greaves M.D.   On: 01/16/2019 18:38       Assessment & Plan:    Principal Problem:   Cellulitis  Cellulitis/ Foreign body NPO after MN Dilaudid 0.5mg  iv q4h prn  Zofran 4mg  iv q6h prn  Zosyn iv pharmacy to dose Dr. to evaluate in AM, appreciate input.   Asthma Albuterol HFA 2puff q6h prn   Prediabetes Cont Metformin  fsbs q4h, ISS   DVT Prophylaxis-   Lovenox - SCDs   AM Labs Ordered, also please review Full Orders  Family Communication: Admission, patients condition and plan of care including tests being ordered have been discussed with the patient  who indicate understanding and agree with the plan and Code Status.  Code Status:  FULL CODE per patient, attempted to contact daugther at number in computer,  Disconnected, not in service.   Admission status: Observation: Based on patients clinical presentation and evaluation of above clinical data, I have made determination that patient meets observation criteria at this time.  Time spent in minutes : 55 minutes    Graciela Husbands M.D on 01/16/2019 at 10:10 PM

## 2019-01-17 ENCOUNTER — Observation Stay: Payer: 59

## 2019-01-17 ENCOUNTER — Encounter
Admission: EM | Disposition: A | Discharge status: Home / Self Care | Payer: Self-pay | Source: Home / Self Care | Attending: Emergency Medicine

## 2019-01-17 ENCOUNTER — Encounter: Payer: Self-pay | Admitting: Internal Medicine

## 2019-01-17 ENCOUNTER — Observation Stay: Payer: 59 | Admitting: Anesthesiology

## 2019-01-17 DIAGNOSIS — M795 Residual foreign body in soft tissue: Secondary | ICD-10-CM

## 2019-01-17 DIAGNOSIS — L03116 Cellulitis of left lower limb: Secondary | ICD-10-CM | POA: Diagnosis not present

## 2019-01-17 HISTORY — PX: IRRIGATION AND DEBRIDEMENT FOOT: SHX6602

## 2019-01-17 LAB — CBC
HCT: 35.5 % — ABNORMAL LOW (ref 36.0–46.0)
Hemoglobin: 10.5 g/dL — ABNORMAL LOW (ref 12.0–15.0)
MCH: 21.6 pg — ABNORMAL LOW (ref 26.0–34.0)
MCHC: 29.6 g/dL — ABNORMAL LOW (ref 30.0–36.0)
MCV: 73 fL — ABNORMAL LOW (ref 80.0–100.0)
Platelets: 211 10*3/uL (ref 150–400)
RBC: 4.86 MIL/uL (ref 3.87–5.11)
RDW: 15.1 % (ref 11.5–15.5)
WBC: 5.7 10*3/uL (ref 4.0–10.5)
nRBC: 0 % (ref 0.0–0.2)

## 2019-01-17 LAB — GLUCOSE, CAPILLARY
Glucose-Capillary: 124 mg/dL — ABNORMAL HIGH (ref 70–99)
Glucose-Capillary: 144 mg/dL — ABNORMAL HIGH (ref 70–99)
Glucose-Capillary: 78 mg/dL (ref 70–99)
Glucose-Capillary: 87 mg/dL (ref 70–99)
Glucose-Capillary: 97 mg/dL (ref 70–99)

## 2019-01-17 LAB — COMPREHENSIVE METABOLIC PANEL
ALT: 21 U/L (ref 0–44)
AST: 17 U/L (ref 15–41)
Albumin: 3.5 g/dL (ref 3.5–5.0)
Alkaline Phosphatase: 63 U/L (ref 38–126)
Anion gap: 7 (ref 5–15)
BUN: 11 mg/dL (ref 6–20)
CO2: 26 mmol/L (ref 22–32)
Calcium: 8.7 mg/dL — ABNORMAL LOW (ref 8.9–10.3)
Chloride: 105 mmol/L (ref 98–111)
Creatinine, Ser: 0.8 mg/dL (ref 0.44–1.00)
GFR calc Af Amer: >60 mL/min (ref >60)
GFR calc non Af Amer: >60 mL/min (ref >60)
Glucose, Bld: 109 mg/dL — ABNORMAL HIGH (ref 70–99)
Potassium: 3.7 mmol/L (ref 3.5–5.1)
Sodium: 138 mmol/L (ref 135–145)
Total Bilirubin: 0.9 mg/dL (ref 0.3–1.2)
Total Protein: 6.9 g/dL (ref 6.5–8.1)

## 2019-01-17 LAB — RESPIRATORY PANEL BY RT PCR (FLU A&B, COVID)
Influenza A by PCR: NEGATIVE
Influenza B by PCR: NEGATIVE
SARS Coronavirus 2 by RT PCR: NEGATIVE

## 2019-01-17 LAB — HEMOGLOBIN A1C
Hgb A1c MFr Bld: 5.7 % — ABNORMAL HIGH (ref 4.8–5.6)
Mean Plasma Glucose: 116.89 mg/dL

## 2019-01-17 LAB — HIV ANTIBODY (ROUTINE TESTING W REFLEX): HIV Screen 4th Generation wRfx: NONREACTIVE

## 2019-01-17 SURGERY — IRRIGATION AND DEBRIDEMENT FOOT
Anesthesia: General | Site: Foot | Laterality: Left

## 2019-01-17 MED ORDER — FENTANYL CITRATE (PF) 100 MCG/2ML IJ SOLN: 25.0000 ug | INTRAMUSCULAR | Status: DC | PRN

## 2019-01-17 MED ORDER — LEVOTHYROXINE SODIUM 50 MCG PO TABS
50.0000 ug | ORAL_TABLET | Freq: Every morning | ORAL | Status: DC
  Administered 2019-01-18: 50 ug via ORAL
  Filled 2019-01-17 (×2): qty 1

## 2019-01-17 MED ORDER — LIDOCAINE HCL (CARDIAC) PF 100 MG/5ML IV SOSY
PREFILLED_SYRINGE | INTRAVENOUS | Status: DC | PRN
  Administered 2019-01-17: 100 mg via INTRAVENOUS

## 2019-01-17 MED ORDER — HYDROMORPHONE HCL 1 MG/ML IJ SOLN
INTRAMUSCULAR | Status: AC
  Administered 2019-01-17: 0.5 mg via INTRAVENOUS
  Filled 2019-01-17: qty 0.5

## 2019-01-17 MED ORDER — ONDANSETRON HCL 4 MG/2ML IJ SOLN
INTRAMUSCULAR | Status: DC | PRN
  Administered 2019-01-17: 4 mg via INTRAVENOUS

## 2019-01-17 MED ORDER — MIDAZOLAM HCL 2 MG/2ML IJ SOLN
INTRAMUSCULAR | Status: AC
  Filled 2019-01-17: qty 2

## 2019-01-17 MED ORDER — HYDROCODONE-ACETAMINOPHEN 5-325 MG PO TABS
1.0000 | ORAL_TABLET | ORAL | Status: DC | PRN
  Administered 2019-01-18: 1 via ORAL
  Filled 2019-01-17: qty 1

## 2019-01-17 MED ORDER — FENTANYL CITRATE (PF) 100 MCG/2ML IJ SOLN
INTRAMUSCULAR | Status: AC
  Filled 2019-01-17: qty 2

## 2019-01-17 MED ORDER — ONDANSETRON HCL 4 MG/2ML IJ SOLN
INTRAMUSCULAR | Status: AC
  Administered 2019-01-17: 4 mg via INTRAVENOUS
  Filled 2019-01-17: qty 2

## 2019-01-17 MED ORDER — INSULIN ASPART 100 UNIT/ML ~~LOC~~ SOLN
SUBCUTANEOUS | Status: AC
  Filled 2019-01-17: qty 1

## 2019-01-17 MED ORDER — ALBUTEROL SULFATE (2.5 MG/3ML) 0.083% IN NEBU: 2.5000 mg | INHALATION_SOLUTION | Freq: Four times a day (QID) | RESPIRATORY_TRACT | Status: DC | PRN

## 2019-01-17 MED ORDER — LEVOTHYROXINE SODIUM 50 MCG PO TABS: 25.0000 ug | ORAL_TABLET | ORAL | Status: DC

## 2019-01-17 MED ORDER — METFORMIN HCL 500 MG PO TABS: 500.0000 mg | ORAL_TABLET | Freq: Every morning | ORAL | Status: DC

## 2019-01-17 MED ORDER — INFLUENZA VAC SPLIT QUAD 0.5 ML IM SUSY
0.5000 mL | PREFILLED_SYRINGE | INTRAMUSCULAR | Status: AC
  Administered 2019-01-18: 0.5 mL via INTRAMUSCULAR
  Filled 2019-01-17: qty 0.5

## 2019-01-17 MED ORDER — SODIUM CHLORIDE 0.9 % IV SOLN: INTRAVENOUS | Status: DC | PRN

## 2019-01-17 MED ORDER — PROPOFOL 10 MG/ML IV BOLUS
INTRAVENOUS | Status: AC
  Filled 2019-01-17: qty 20

## 2019-01-17 MED ORDER — OXYCODONE HCL 5 MG/5ML PO SOLN: 5.0000 mg | Freq: Once | ORAL | Status: DC | PRN

## 2019-01-17 MED ORDER — OXYCODONE HCL 5 MG PO TABS: 5.0000 mg | ORAL_TABLET | Freq: Once | ORAL | Status: DC | PRN

## 2019-01-17 MED ORDER — PROPOFOL 10 MG/ML IV BOLUS
INTRAVENOUS | Status: DC | PRN
  Administered 2019-01-17: 20 mg via INTRAVENOUS
  Administered 2019-01-17: 40 mg via INTRAVENOUS
  Administered 2019-01-17: 30 mg via INTRAVENOUS
  Administered 2019-01-17: 60 mg via INTRAVENOUS
  Administered 2019-01-17: 30 mg via INTRAVENOUS

## 2019-01-17 MED ORDER — ONDANSETRON HCL 4 MG/2ML IJ SOLN
INTRAMUSCULAR | Status: AC
  Filled 2019-01-17: qty 2

## 2019-01-17 MED ORDER — MIDAZOLAM HCL 2 MG/2ML IJ SOLN
INTRAMUSCULAR | Status: DC | PRN
  Administered 2019-01-17: 2 mg via INTRAVENOUS

## 2019-01-17 MED ORDER — BUPIVACAINE HCL (PF) 0.5 % IJ SOLN
INTRAMUSCULAR | Status: DC | PRN
  Administered 2019-01-17: 10 mL

## 2019-01-17 SURGICAL SUPPLY — 55 items
"PENCIL ELECTRO HAND CTR " (MISCELLANEOUS) ×1 IMPLANT
BLADE MINI RND TIP GREEN BEAV (BLADE) ×2 IMPLANT
BLADE OSCILLATING/SAGITTAL (BLADE)
BLADE SURG 15 STRL LF DISP TIS (BLADE) ×1 IMPLANT
BLADE SURG 15 STRL SS (BLADE) ×2
BLADE SW THK.38XMED LNG THN (BLADE) IMPLANT
BNDG CONFORM 2 STRL LF (GAUZE/BANDAGES/DRESSINGS) ×3 IMPLANT
BNDG CONFORM 3 STRL LF (GAUZE/BANDAGES/DRESSINGS) ×2 IMPLANT
BNDG ELASTIC 4X5.8 VLCR NS LF (GAUZE/BANDAGES/DRESSINGS) ×3 IMPLANT
BNDG ELASTIC 4X5.8 VLCR STR LF (GAUZE/BANDAGES/DRESSINGS) ×2 IMPLANT
BNDG ESMARK 4X12 TAN STRL LF (GAUZE/BANDAGES/DRESSINGS) ×3 IMPLANT
BNDG GAUZE 4.5X4.1 6PLY STRL (MISCELLANEOUS) ×3 IMPLANT
CANISTER SUCT 1200ML W/VALVE (MISCELLANEOUS) ×3 IMPLANT
COVER WAND RF STERILE (DRAPES) ×3 IMPLANT
CUFF TOURN SGL QUICK 12 (TOURNIQUET CUFF) IMPLANT
CUFF TOURN SGL QUICK 18X4 (TOURNIQUET CUFF) IMPLANT
DRAPE FLUOR MINI C-ARM 54X84 (DRAPES) IMPLANT
DURAPREP 26ML APPLICATOR (WOUND CARE) ×3 IMPLANT
ELECT REM PT RETURN 9FT ADLT (ELECTROSURGICAL) ×3
ELECTRODE REM PT RTRN 9FT ADLT (ELECTROSURGICAL) ×1 IMPLANT
GAUZE SPONGE 4X4 12PLY STRL (GAUZE/BANDAGES/DRESSINGS) ×3 IMPLANT
GAUZE XEROFORM 1X8 LF (GAUZE/BANDAGES/DRESSINGS) ×3 IMPLANT
GLOVE BIO SURGEON STRL SZ7.5 (GLOVE) ×3 IMPLANT
GLOVE INDICATOR 8.0 STRL GRN (GLOVE) ×3 IMPLANT
GOWN STRL REUS W/ TWL LRG LVL3 (GOWN DISPOSABLE) ×2 IMPLANT
GOWN STRL REUS W/TWL LRG LVL3 (GOWN DISPOSABLE) ×4
HANDPIECE VERSAJET DEBRIDEMENT (MISCELLANEOUS) ×3 IMPLANT
KIT TURNOVER KIT A (KITS) ×3 IMPLANT
LABEL OR SOLS (LABEL) ×3 IMPLANT
NDL FILTER BLUNT 18X1 1/2 (NEEDLE) ×1 IMPLANT
NDL HYPO 25X1 1.5 SAFETY (NEEDLE) ×3 IMPLANT
NEEDLE FILTER BLUNT 18X 1/2SAF (NEEDLE) ×2
NEEDLE FILTER BLUNT 18X1 1/2 (NEEDLE) ×1 IMPLANT
NEEDLE HYPO 25X1 1.5 SAFETY (NEEDLE) ×9 IMPLANT
NS IRRIG 500ML POUR BTL (IV SOLUTION) ×3 IMPLANT
PACK EXTREMITY ARMC (MISCELLANEOUS) ×3 IMPLANT
PAD ABD DERMACEA PRESS 5X9 (GAUZE/BANDAGES/DRESSINGS) ×4 IMPLANT
PENCIL ELECTRO HAND CTR (MISCELLANEOUS) ×3 IMPLANT
RASP SM TEAR CROSS CUT (RASP) IMPLANT
SOL PREP PVP 2OZ (MISCELLANEOUS) ×3
SOLUTION PREP PVP 2OZ (MISCELLANEOUS) ×1 IMPLANT
STOCKINETTE 48X4 2 PLY STRL (GAUZE/BANDAGES/DRESSINGS) ×1 IMPLANT
STOCKINETTE STRL 4IN 9604848 (GAUZE/BANDAGES/DRESSINGS) ×3 IMPLANT
STOCKINETTE STRL 6IN 960660 (GAUZE/BANDAGES/DRESSINGS) ×3 IMPLANT
SUT ETHILON 3-0 FS-10 30 BLK (SUTURE) ×3
SUT ETHILON 4-0 (SUTURE) ×2
SUT ETHILON 4-0 FS2 18XMFL BLK (SUTURE) ×1
SUT VIC AB 3-0 SH 27 (SUTURE) ×2
SUT VIC AB 3-0 SH 27X BRD (SUTURE) ×1 IMPLANT
SUT VIC AB 4-0 FS2 27 (SUTURE) ×3 IMPLANT
SUTURE EHLN 3-0 FS-10 30 BLK (SUTURE) ×1 IMPLANT
SUTURE ETHLN 4-0 FS2 18XMF BLK (SUTURE) ×1 IMPLANT
SYR 10ML LL (SYRINGE) ×6 IMPLANT
SYR 20ML LL LF (SYRINGE) ×2 IMPLANT
SYR 3ML LL SCALE MARK (SYRINGE) ×3 IMPLANT

## 2019-01-17 NOTE — Consult Note (Signed)
Reason for Consult: Cellulitis with foreign body left foot. Referring Physician: Alam  Beverly Huber is an 59 y.o. female.  HPI: This is a 59-year-old female who recently stepped on a foreign body.  States she thinks this was a toothpick.  States she did get a little bit out but continued to have some pain with increased swelling and presented to the emergency department.  States she had an ultrasound taken where a foreign body was clearly identified but states they could not figure out how to save the pictures.  Decision was made for admission to go ahead and facilitate I&D of the foreign body in the left foot.  Past Medical History:  Diagnosis Date  . Asthma   . Prediabetes   . Thyroid disease     History reviewed. No pertinent surgical history.  Family History  Problem Relation Age of Onset  . Hypothyroidism Mother   . Breast cancer Neg Hx     Social History:  reports that she has never smoked. She has never used smokeless tobacco. She reports previous alcohol use. No history on file for drug.  Allergies: No Known Allergies  Medications:  Scheduled: . enoxaparin (LOVENOX) injection  40 mg Subcutaneous Q24H  . insulin aspart  0-9 Units Subcutaneous Q4H  . [START ON 01/19/2019] levothyroxine  25 mcg Oral Q Wed  . levothyroxine  50 mcg Oral q morning - 10a  . lidocaine (PF)  5 mL Intradermal Once  . metFORMIN  500 mg Oral q morning - 10a    Results for orders placed or performed during the hospital encounter of 01/16/19 (from the past 48 hour(s))  CBC with Differential     Status: Abnormal   Collection Time: 01/16/19  9:08 PM  Result Value Ref Range   WBC 8.0 4.0 - 10.5 K/uL   RBC 5.44 (H) 3.87 - 5.11 MIL/uL   Hemoglobin 11.8 (L) 12.0 - 15.0 g/dL   HCT 40.0 36.0 - 46.0 %   MCV 73.5 (L) 80.0 - 100.0 fL   MCH 21.7 (L) 26.0 - 34.0 pg   MCHC 29.5 (L) 30.0 - 36.0 g/dL   RDW 15.3 11.5 - 15.5 %   Platelets 230 150 - 400 K/uL   nRBC 0.0 0.0 - 0.2 %   Neutrophils Relative  % 60 %   Neutro Abs 4.8 1.7 - 7.7 K/uL   Lymphocytes Relative 32 %   Lymphs Abs 2.5 0.7 - 4.0 K/uL   Monocytes Relative 7 %   Monocytes Absolute 0.5 0.1 - 1.0 K/uL   Eosinophils Relative 1 %   Eosinophils Absolute 0.1 0.0 - 0.5 K/uL   Basophils Relative 0 %   Basophils Absolute 0.0 0.0 - 0.1 K/uL   Immature Granulocytes 0 %   Abs Immature Granulocytes 0.03 0.00 - 0.07 K/uL    Comment: Performed at [facility], [address] [place], [place] [address]  Comprehensive metabolic panel     Status: Abnormal   Collection Time: 01/16/19  9:08 PM  Result Value Ref Range   Sodium 140 135 - 145 mmol/L   Potassium 3.6 3.5 - 5.1 mmol/L   Chloride 104 98 - 111 mmol/L   CO2 27 22 - 32 mmol/L   Glucose, Bld 111 (H) 70 - 99 mg/dL   BUN 10 6 - 20 mg/dL   Creatinine, Ser 0.82 0.44 - 1.00 mg/dL   Calcium 9.4 8.9 - 10.3 mg/dL   Total Protein 8.5 (H) 6.5 - 8.1 g/dL     Albumin 4.3 3.5 - 5.0 g/dL   AST 17 15 - 41 U/L   ALT 20 0 - 44 U/L   Alkaline Phosphatase 74 38 - 126 U/L   Total Bilirubin 0.9 0.3 - 1.2 mg/dL   GFR calc non Af Amer >60 >60 mL/min   GFR calc Af Amer >60 >60 mL/min   Anion gap 9 5 - 15    Comment: Performed at [facility], [address], [place], [place] [address]  Respiratory Panel by RT PCR (Flu A&B, Covid) - Nasopharyngeal Swab     Status: None   Collection Time: 01/16/19  9:08 PM   Specimen: Nasopharyngeal Swab  Result Value Ref Range   SARS Coronavirus 2 by RT PCR NEGATIVE NEGATIVE    Comment: (NOTE) SARS-CoV-2 target nucleic acids are NOT DETECTED. The SARS-CoV-2 RNA is generally detectable in upper respiratoy specimens during the acute phase of infection. The lowest concentration of SARS-CoV-2 viral copies this assay can detect is 131 copies/mL. A negative result does not preclude SARS-Cov-2 infection and should not be used as the sole basis for treatment or other patient management decisions. A negative result may occur with  improper specimen  collection/handling, submission of specimen other than nasopharyngeal swab, presence of viral mutation(s) within the areas targeted by this assay, and inadequate number of viral copies (<131 copies/mL). A negative result must be combined with clinical observations, patient history, and epidemiological information. The expected result is Negative. Fact Sheet for Patients:  https://www.moore.com/ Fact Sheet for Healthcare Providers:  https://www.young.biz/ This test is not yet ap proved or cleared by the [place] FDA and  has been authorized for detection and/or diagnosis of SARS-CoV-2 by FDA under an Emergency Use Authorization (EUA). This EUA will remain  in effect (meaning this test can be used) for the duration of the COVID-19 declaration under Section 564(b)(1) of the Act, 21 U.S.C. section 360bbb-3(b)(1), unless the authorization is terminated or revoked sooner.    Influenza A by PCR NEGATIVE NEGATIVE   Influenza B by PCR NEGATIVE NEGATIVE    Comment: (NOTE) The Xpert Xpress SARS-CoV-2/FLU/RSV assay is intended as an aid in  the diagnosis of influenza from Nasopharyngeal swab specimens and  should not be used as a sole basis for treatment. Nasal washings and  aspirates are unacceptable for Xpert Xpress SARS-CoV-2/FLU/RSV  testing. Fact Sheet for Patients: https://www.moore.com/ Fact Sheet for Healthcare Providers: https://www.young.biz/ This test is not yet approved or cleared by the [place] FDA and  has been authorized for detection and/or diagnosis of SARS-CoV-2 by  FDA under an Emergency Use Authorization (EUA). This EUA will remain  in effect (meaning this test can be used) for the duration of the  Covid-19 declaration under Section 564(b)(1) of the Act, 21  U.S.C. section 360bbb-3(b)(1), unless the authorization is  terminated or revoked. Performed at [facility], [address] [place], [place] [address]   Glucose, capillary     Status: Abnormal   Collection Time: 01/17/19 12:37 AM  Result Value Ref Range   Glucose-Capillary 124 (H) 70 - 99 mg/dL  Comprehensive metabolic panel     Status: Abnormal   Collection Time: 01/17/19  5:00 AM  Result Value Ref Range   Sodium 138 135 - 145 mmol/L   Potassium 3.7 3.5 - 5.1 mmol/L   Chloride 105 98 - 111 mmol/L   CO2 26 22 - 32 mmol/L   Glucose, Bld 109 (H) 70 - 99 mg/dL   BUN 11 6 -  20 mg/dL   Creatinine, Ser 6.14 0.44 - 1.00 mg/dL   Calcium 8.7 (L) 8.9 - 10.3 mg/dL   Total Protein 6.9 6.5 - 8.1 g/dL   Albumin 3.5 3.5 - 5.0 g/dL   AST 17 15 - 41 U/L   ALT 21 0 - 44 U/L   Alkaline Phosphatase 63 38 - 126 U/L   Total Bilirubin 0.9 0.3 - 1.2 mg/dL   GFR calc non Af Amer >60 >60 mL/min   GFR calc Af Amer >60 >60 mL/min   Anion gap 7 5 - 15    Comment: Performed at [facility], [address] [place], [place] [address]  CBC     Status: Abnormal   Collection Time: 01/17/19  5:00 AM  Result Value Ref Range   WBC 5.7 4.0 - 10.5 K/uL   RBC 4.86 3.87 - 5.11 MIL/uL   Hemoglobin 10.5 (L) 12.0 - 15.0 g/dL   HCT 00.8 (L) 67.6 - 19.5 %   MCV 73.0 (L) 80.0 - 100.0 fL   MCH 21.6 (L) 26.0 - 34.0 pg   MCHC 29.6 (L) 30.0 - 36.0 g/dL   RDW 09.3 26.7 - 12.4 %   Platelets 211 150 - 400 K/uL   nRBC 0.0 0.0 - 0.2 %    Comment: Performed at [facility], [address] [place], [place] [address]  Glucose, capillary     Status: None   Collection Time: 01/17/19  5:11 AM  Result Value Ref Range   Glucose-Capillary 97 70 - 99 mg/dL    DG Foot Complete Left  Result Date: 01/16/2019 CLINICAL DATA:  Stepped on piece of wood last night. Possible splinter. Swelling. EXAM: LEFT FOOT - COMPLETE 3+ VIEW COMPARISON:  None. FINDINGS: No acute fracture or dislocation. No radiopaque foreign object. Suspect mild dorsal soft tissue swelling about the forefoot on the lateral view. Tiny Achilles spur. IMPRESSION:  No acute osseous abnormality. Electronically Signed   By: Jeronimo Greaves M.D.   On: 01/16/2019 18:38    Review of Systems  Constitutional: Negative for chills and fever.  HENT: Negative for congestion and sore throat.   Respiratory: Negative for cough and shortness of breath.   Musculoskeletal:       Relates pain and swelling in her left foot after recently stepping on a toothpick  Neurological: Negative for numbness.   Blood pressure 130/88, pulse 76, temperature 97.8 F (36.6 C), resp. rate 18, height 5' 3.5" (1.613 m), weight 95.3 kg, SpO2 98 %. Physical Exam  Cardiovascular:  PT and DP pulses are fully palpable bilateral.  Musculoskeletal:     Comments: Guarded range of motion in the left foot.  Significant pain on palpation beneath the second toe area at the foreign body entrance site.  Neurological:  Sensation grossly intact.  Skin:  Edema is noted in the left foot.  A small entrance wound is present around the base of the second toe with no evidence of any drainage.  Skin is otherwise warm dry and supple.    Assessment/Plan: Assessment: Foreign body with cellulitis left foot.  Plan: Discussed with the patient the need for removal of the foreign body.  Discussed that we will need to repeat the ultrasound as images could not be obtained and it sounds like this was performed in the ED by the attending.  Discussed possible risks and complications of the procedure including continued infection which may require further debridement.  Questions invited and answered.  We will plan for  I&D later this evening  Durward Fortes 01/17/2019, 8:25 AM

## 2019-01-17 NOTE — H&P (View-Only) (Signed)
Reason for Consult: Cellulitis with foreign body left foot. Referring Physician: Abigial Newville is an 59 y.o. female.  HPI: This is a 60 year old female who recently stepped on a foreign body.  States she thinks this was a toothpick.  States she did get a little bit out but continued to have some pain with increased swelling and presented to the emergency department.  States she had an ultrasound taken where a foreign body was clearly identified but states they could not figure out how to save the pictures.  Decision was made for admission to go ahead and facilitate I&D of the foreign body in the left foot.  Past Medical History:  Diagnosis Date  . Asthma   . Prediabetes   . Thyroid disease     History reviewed. No pertinent surgical history.  Family History  Problem Relation Age of Onset  . Hypothyroidism Mother   . Breast cancer Neg Hx     Social History:  reports that she has never smoked. She has never used smokeless tobacco. She reports previous alcohol use. No history on file for drug.  Allergies: No Known Allergies  Medications:  Scheduled: . enoxaparin (LOVENOX) injection  40 mg Subcutaneous Q24H  . insulin aspart  0-9 Units Subcutaneous Q4H  . [START ON 01/19/2019] levothyroxine  25 mcg Oral Q Wed  . levothyroxine  50 mcg Oral q morning - 10a  . lidocaine (PF)  5 mL Intradermal Once  . metFORMIN  500 mg Oral q morning - 10a    Results for orders placed or performed during the hospital encounter of 01/16/19 (from the past 48 hour(s))  CBC with Differential     Status: Abnormal   Collection Time: 01/16/19  9:08 PM  Result Value Ref Range   WBC 8.0 4.0 - 10.5 K/uL   RBC 5.44 (H) 3.87 - 5.11 MIL/uL   Hemoglobin 11.8 (L) 12.0 - 15.0 g/dL   HCT 69.4 85.4 - 62.7 %   MCV 73.5 (L) 80.0 - 100.0 fL   MCH 21.7 (L) 26.0 - 34.0 pg   MCHC 29.5 (L) 30.0 - 36.0 g/dL   RDW 03.5 00.9 - 38.1 %   Platelets 230 150 - 400 K/uL   nRBC 0.0 0.0 - 0.2 %   Neutrophils Relative  % 60 %   Neutro Abs 4.8 1.7 - 7.7 K/uL   Lymphocytes Relative 32 %   Lymphs Abs 2.5 0.7 - 4.0 K/uL   Monocytes Relative 7 %   Monocytes Absolute 0.5 0.1 - 1.0 K/uL   Eosinophils Relative 1 %   Eosinophils Absolute 0.1 0.0 - 0.5 K/uL   Basophils Relative 0 %   Basophils Absolute 0.0 0.0 - 0.1 K/uL   Immature Granulocytes 0 %   Abs Immature Granulocytes 0.03 0.00 - 0.07 K/uL    Comment: Performed at [facility], [address] [place], [place] [address]  Comprehensive metabolic panel     Status: Abnormal   Collection Time: 01/16/19  9:08 PM  Result Value Ref Range   Sodium 140 135 - 145 mmol/L   Potassium 3.6 3.5 - 5.1 mmol/L   Chloride 104 98 - 111 mmol/L   CO2 27 22 - 32 mmol/L   Glucose, Bld 111 (H) 70 - 99 mg/dL   BUN 10 6 - 20 mg/dL   Creatinine, Ser 7.16 0.44 - 1.00 mg/dL   Calcium 9.4 8.9 - 96.7 mg/dL   Total Protein 8.5 (H) 6.5 - 8.1 g/dL  Albumin 4.3 3.5 - 5.0 g/dL   AST 17 15 - 41 U/L   ALT 20 0 - 44 U/L   Alkaline Phosphatase 74 38 - 126 U/L   Total Bilirubin 0.9 0.3 - 1.2 mg/dL   GFR calc non Af Amer >60 >60 mL/min   GFR calc Af Amer >60 >60 mL/min   Anion gap 9 5 - 15    Comment: Performed at [facility], [address], [place], [place] [address]  Respiratory Panel by RT PCR (Flu A&B, Covid) - Nasopharyngeal Swab     Status: None   Collection Time: 01/16/19  9:08 PM   Specimen: Nasopharyngeal Swab  Result Value Ref Range   SARS Coronavirus 2 by RT PCR NEGATIVE NEGATIVE    Comment: (NOTE) SARS-CoV-2 target nucleic acids are NOT DETECTED. The SARS-CoV-2 RNA is generally detectable in upper respiratoy specimens during the acute phase of infection. The lowest concentration of SARS-CoV-2 viral copies this assay can detect is 131 copies/mL. A negative result does not preclude SARS-Cov-2 infection and should not be used as the sole basis for treatment or other patient management decisions. A negative result may occur with  improper specimen  collection/handling, submission of specimen other than nasopharyngeal swab, presence of viral mutation(s) within the areas targeted by this assay, and inadequate number of viral copies (<131 copies/mL). A negative result must be combined with clinical observations, patient history, and epidemiological information. The expected result is Negative. Fact Sheet for Patients:  https://www.moore.com/ Fact Sheet for Healthcare Providers:  https://www.young.biz/ This test is not yet ap proved or cleared by the [place] FDA and  has been authorized for detection and/or diagnosis of SARS-CoV-2 by FDA under an Emergency Use Authorization (EUA). This EUA will remain  in effect (meaning this test can be used) for the duration of the COVID-19 declaration under Section 564(b)(1) of the Act, 21 U.S.C. section 360bbb-3(b)(1), unless the authorization is terminated or revoked sooner.    Influenza A by PCR NEGATIVE NEGATIVE   Influenza B by PCR NEGATIVE NEGATIVE    Comment: (NOTE) The Xpert Xpress SARS-CoV-2/FLU/RSV assay is intended as an aid in  the diagnosis of influenza from Nasopharyngeal swab specimens and  should not be used as a sole basis for treatment. Nasal washings and  aspirates are unacceptable for Xpert Xpress SARS-CoV-2/FLU/RSV  testing. Fact Sheet for Patients: https://www.moore.com/ Fact Sheet for Healthcare Providers: https://www.young.biz/ This test is not yet approved or cleared by the [place] FDA and  has been authorized for detection and/or diagnosis of SARS-CoV-2 by  FDA under an Emergency Use Authorization (EUA). This EUA will remain  in effect (meaning this test can be used) for the duration of the  Covid-19 declaration under Section 564(b)(1) of the Act, 21  U.S.C. section 360bbb-3(b)(1), unless the authorization is  terminated or revoked. Performed at [facility], [address] [place], [place] [address]   Glucose, capillary     Status: Abnormal   Collection Time: 01/17/19 12:37 AM  Result Value Ref Range   Glucose-Capillary 124 (H) 70 - 99 mg/dL  Comprehensive metabolic panel     Status: Abnormal   Collection Time: 01/17/19  5:00 AM  Result Value Ref Range   Sodium 138 135 - 145 mmol/L   Potassium 3.7 3.5 - 5.1 mmol/L   Chloride 105 98 - 111 mmol/L   CO2 26 22 - 32 mmol/L   Glucose, Bld 109 (H) 70 - 99 mg/dL   BUN 11 6 -  20 mg/dL   Creatinine, Ser 6.14 0.44 - 1.00 mg/dL   Calcium 8.7 (L) 8.9 - 10.3 mg/dL   Total Protein 6.9 6.5 - 8.1 g/dL   Albumin 3.5 3.5 - 5.0 g/dL   AST 17 15 - 41 U/L   ALT 21 0 - 44 U/L   Alkaline Phosphatase 63 38 - 126 U/L   Total Bilirubin 0.9 0.3 - 1.2 mg/dL   GFR calc non Af Amer >60 >60 mL/min   GFR calc Af Amer >60 >60 mL/min   Anion gap 7 5 - 15    Comment: Performed at [facility], [address] [place], [place] [address]  CBC     Status: Abnormal   Collection Time: 01/17/19  5:00 AM  Result Value Ref Range   WBC 5.7 4.0 - 10.5 K/uL   RBC 4.86 3.87 - 5.11 MIL/uL   Hemoglobin 10.5 (L) 12.0 - 15.0 g/dL   HCT 00.8 (L) 67.6 - 19.5 %   MCV 73.0 (L) 80.0 - 100.0 fL   MCH 21.6 (L) 26.0 - 34.0 pg   MCHC 29.6 (L) 30.0 - 36.0 g/dL   RDW 09.3 26.7 - 12.4 %   Platelets 211 150 - 400 K/uL   nRBC 0.0 0.0 - 0.2 %    Comment: Performed at [facility], [address] [place], [place] [address]  Glucose, capillary     Status: None   Collection Time: 01/17/19  5:11 AM  Result Value Ref Range   Glucose-Capillary 97 70 - 99 mg/dL    DG Foot Complete Left  Result Date: 01/16/2019 CLINICAL DATA:  Stepped on piece of wood last night. Possible splinter. Swelling. EXAM: LEFT FOOT - COMPLETE 3+ VIEW COMPARISON:  None. FINDINGS: No acute fracture or dislocation. No radiopaque foreign object. Suspect mild dorsal soft tissue swelling about the forefoot on the lateral view. Tiny Achilles spur. IMPRESSION:  No acute osseous abnormality. Electronically Signed   By: Jeronimo Greaves M.D.   On: 01/16/2019 18:38    Review of Systems  Constitutional: Negative for chills and fever.  HENT: Negative for congestion and sore throat.   Respiratory: Negative for cough and shortness of breath.   Musculoskeletal:       Relates pain and swelling in her left foot after recently stepping on a toothpick  Neurological: Negative for numbness.   Blood pressure 130/88, pulse 76, temperature 97.8 F (36.6 C), resp. rate 18, height 5' 3.5" (1.613 m), weight 95.3 kg, SpO2 98 %. Physical Exam  Cardiovascular:  PT and DP pulses are fully palpable bilateral.  Musculoskeletal:     Comments: Guarded range of motion in the left foot.  Significant pain on palpation beneath the second toe area at the foreign body entrance site.  Neurological:  Sensation grossly intact.  Skin:  Edema is noted in the left foot.  A small entrance wound is present around the base of the second toe with no evidence of any drainage.  Skin is otherwise warm dry and supple.    Assessment/Plan: Assessment: Foreign body with cellulitis left foot.  Plan: Discussed with the patient the need for removal of the foreign body.  Discussed that we will need to repeat the ultrasound as images could not be obtained and it sounds like this was performed in the ED by the attending.  Discussed possible risks and complications of the procedure including continued infection which may require further debridement.  Questions invited and answered.  We will plan for  I&D later this evening  Durward Fortes 01/17/2019, 8:25 AM

## 2019-01-17 NOTE — Transfer of Care (Signed)
Immediate Anesthesia Transfer of Care Note  Patient: Beverly Huber  Procedure(s) Performed: IRRIGATION AND DEBRIDEMENT FOOT, LEFT (Left Foot)  Patient Location: PACU  Anesthesia Type:General  Level of Consciousness: awake, alert  and oriented  Airway & Oxygen Therapy: Patient connected to nasal cannula oxygen  Post-op Assessment: Post -op Vital signs reviewed and stable  Post vital signs: stable  Last Vitals:  Vitals Value Taken Time  BP 135/81 01/17/19 1724  Temp    Pulse 60 01/17/19 1724  Resp 18 01/17/19 1724  SpO2 97 % 01/17/19 1724  Vitals shown include unvalidated device data.  Last Pain:  Vitals:   01/17/19 0955  TempSrc:   PainSc: 4          Complications: No apparent anesthesia complications

## 2019-01-17 NOTE — Progress Notes (Addendum)
Subjective: S/P surgery per podiatry this evening.  Was having pain on her left foot.   Objective: Vital signs in last 24 hours: Temp:  [97.4 F (36.3 C)-98.1 F (36.7 C)] 97.4 F (36.3 C) (01/04 0929) Pulse Rate:  [74-86] 74 (01/04 0929) Resp:  [17-18] 18 (01/04 0929) BP: (121-136)/(83-88) 136/83 (01/04 0929) SpO2:  [96 %-100 %] 100 % (01/04 0929) Weight:  [96.5 kg] 96.5 kg (01/04 0929)  Intake/Output from previous day: 01/03 0701 - 01/04 0700 In: 50  Out: -  Intake/Output this shift: No intake/output data recorded.  1.  General: axoxo3 3. Neurologic: nonfocal 4. HEENMT:  Anicteric, pupils 1.30mm symmetric, direct, consensual intact Neck: no jvd  5. Respiratory : CTAB  6. Cardiovascular : rrr s1, s2, no m/g/r  7. Gastrointestinal:  ABd: soft, nt, nd, +bs  8. Skin:  Ext: no c/c,  Slight redness and swelling just below the 2nd mtp area left foot.  Also evidence of puncture mark  9.Musculoskeletal:  Good ROM  Results for orders placed or performed during the hospital encounter of 01/16/19 (from the past 24 hour(s))  CBC with Differential     Status: Abnormal   Collection Time: 01/16/19  9:08 PM  Result Value Ref Range   WBC 8.0 4.0 - 10.5 K/uL   RBC 5.44 (H) 3.87 - 5.11 MIL/uL   Hemoglobin 11.8 (L) 12.0 - 15.0 g/dL   HCT 63.8 75.6 - 43.3 %   MCV 73.5 (L) 80.0 - 100.0 fL   MCH 21.7 (L) 26.0 - 34.0 pg   MCHC 29.5 (L) 30.0 - 36.0 g/dL   RDW 29.5 18.8 - 41.6 %   Platelets 230 150 - 400 K/uL   nRBC 0.0 0.0 - 0.2 %   Neutrophils Relative % 60 %   Neutro Abs 4.8 1.7 - 7.7 K/uL   Lymphocytes Relative 32 %   Lymphs Abs 2.5 0.7 - 4.0 K/uL   Monocytes Relative 7 %   Monocytes Absolute 0.5 0.1 - 1.0 K/uL   Eosinophils Relative 1 %   Eosinophils Absolute 0.1 0.0 - 0.5 K/uL   Basophils Relative 0 %   Basophils Absolute 0.0 0.0 - 0.1 K/uL   Immature Granulocytes 0 %   Abs Immature Granulocytes 0.03 0.00 - 0.07 K/uL  Comprehensive metabolic panel     Status:  Abnormal   Collection Time: 01/16/19  9:08 PM  Result Value Ref Range   Sodium 140 135 - 145 mmol/L   Potassium 3.6 3.5 - 5.1 mmol/L   Chloride 104 98 - 111 mmol/L   CO2 27 22 - 32 mmol/L   Glucose, Bld 111 (H) 70 - 99 mg/dL   BUN 10 6 - 20 mg/dL   Creatinine, Ser 6.06 0.44 - 1.00 mg/dL   Calcium 9.4 8.9 - 30.1 mg/dL   Total Protein 8.5 (H) 6.5 - 8.1 g/dL   Albumin 4.3 3.5 - 5.0 g/dL   AST 17 15 - 41 U/L   ALT 20 0 - 44 U/L   Alkaline Phosphatase 74 38 - 126 U/L   Total Bilirubin 0.9 0.3 - 1.2 mg/dL   GFR calc non Af Amer >60 >60 mL/min   GFR calc Af Amer >60 >60 mL/min   Anion gap 9 5 - 15  Respiratory Panel by RT PCR (Flu A&B, Covid) - Nasopharyngeal Swab     Status: None   Collection Time: 01/16/19  9:08 PM   Specimen: Nasopharyngeal Swab  Result Value Ref Range   SARS Coronavirus  2 by RT PCR NEGATIVE NEGATIVE   Influenza A by PCR NEGATIVE NEGATIVE   Influenza B by PCR NEGATIVE NEGATIVE  Glucose, capillary     Status: Abnormal   Collection Time: 01/17/19 12:37 AM  Result Value Ref Range   Glucose-Capillary 124 (H) 70 - 99 mg/dL  HIV Antibody (routine testing w rflx)     Status: None   Collection Time: 01/17/19  5:00 AM  Result Value Ref Range   HIV Screen 4th Generation wRfx NON REACTIVE NON REACTIVE  Comprehensive metabolic panel     Status: Abnormal   Collection Time: 01/17/19  5:00 AM  Result Value Ref Range   Sodium 138 135 - 145 mmol/L   Potassium 3.7 3.5 - 5.1 mmol/L   Chloride 105 98 - 111 mmol/L   CO2 26 22 - 32 mmol/L   Glucose, Bld 109 (H) 70 - 99 mg/dL   BUN 11 6 - 20 mg/dL   Creatinine, Ser 1.60 0.44 - 1.00 mg/dL   Calcium 8.7 (L) 8.9 - 10.3 mg/dL   Total Protein 6.9 6.5 - 8.1 g/dL   Albumin 3.5 3.5 - 5.0 g/dL   AST 17 15 - 41 U/L   ALT 21 0 - 44 U/L   Alkaline Phosphatase 63 38 - 126 U/L   Total Bilirubin 0.9 0.3 - 1.2 mg/dL   GFR calc non Af Amer >60 >60 mL/min   GFR calc Af Amer >60 >60 mL/min   Anion gap 7 5 - 15  CBC     Status: Abnormal    Collection Time: 01/17/19  5:00 AM  Result Value Ref Range   WBC 5.7 4.0 - 10.5 K/uL   RBC 4.86 3.87 - 5.11 MIL/uL   Hemoglobin 10.5 (L) 12.0 - 15.0 g/dL   HCT 10.9 (L) 32.3 - 55.7 %   MCV 73.0 (L) 80.0 - 100.0 fL   MCH 21.6 (L) 26.0 - 34.0 pg   MCHC 29.6 (L) 30.0 - 36.0 g/dL   RDW 32.2 02.5 - 42.7 %   Platelets 211 150 - 400 K/uL   nRBC 0.0 0.0 - 0.2 %  Hemoglobin A1c     Status: Abnormal   Collection Time: 01/17/19  5:00 AM  Result Value Ref Range   Hgb A1c MFr Bld 5.7 (H) 4.8 - 5.6 %   Mean Plasma Glucose 116.89 mg/dL  Glucose, capillary     Status: None   Collection Time: 01/17/19  5:11 AM  Result Value Ref Range   Glucose-Capillary 97 70 - 99 mg/dL  Glucose, capillary     Status: Abnormal   Collection Time: 01/17/19 10:55 AM  Result Value Ref Range   Glucose-Capillary 144 (H) 70 - 99 mg/dL    Studies/Results: DG Foot Complete Left  Result Date: 01/16/2019 CLINICAL DATA:  Stepped on piece of wood last night. Possible splinter. Swelling. EXAM: LEFT FOOT - COMPLETE 3+ VIEW COMPARISON:  None. FINDINGS: No acute fracture or dislocation. No radiopaque foreign object. Suspect mild dorsal soft tissue swelling about the forefoot on the lateral view. Tiny Achilles spur. IMPRESSION: No acute osseous abnormality. Electronically Signed   By: Jeronimo Greaves M.D.   On: 01/16/2019 18:38   [place] LT LOWER EXTREM LTD SOFT TISSUE NON VASCULAR  Addendum Date: 01/17/2019   ADDENDUM REPORT: 01/17/2019 09:47 ADDENDUM: Correction to findings and impression FINDINGS: Ultrasound performed of the plantar foot area of concern along the second MTP joint. IMPRESSION: Obliquely oriented linear area of shadowing in the plantar foot area  of concern suspicious for retained foreign body. Electronically Signed   By: Jerilynn Mages.  Shick M.D.   On: 01/17/2019 09:47   Result Date: 01/17/2019 CLINICAL DATA:  Stepped on a toothpick, foreign body EXAM: ULTRASOUND LEFT LOWER EXTREMITY LIMITED TECHNIQUE: Ultrasound examination of the  lower extremity soft tissues was performed in the area of clinical concern. COMPARISON:  01/16/2019 FINDINGS: Ultrasound performed of the dorsal foot in the area of concern along the second MTP joint. This demonstrates a superficial thin linear area of shadowing which is slightly oblique in orientation suspicious for a retained soft tissue foreign body. Mild surrounding edema. This roughly measures 2.5 cm in length. IMPRESSION: Obliquely oriented linear area of shadowing in the dorsal foot area of concern suspicious for a retained foreign body. Electronically Signed: By: Jerilynn Mages.  Shick M.D. On: 01/17/2019 08:47    Scheduled Meds: . [MAR Hold] enoxaparin (LOVENOX) injection  40 mg Subcutaneous Q24H  . insulin aspart      . [MAR Hold] insulin aspart  0-9 Units Subcutaneous Q4H  . [MAR Hold] levothyroxine  25 mcg Oral Q Wed  . [MAR Hold] levothyroxine  50 mcg Oral q morning - 10a  . [MAR Hold] lidocaine (PF)  5 mL Intradermal Once  . [MAR Hold] metFORMIN  500 mg Oral q morning - 10a   Continuous Infusions: . [MAR Hold] piperacillin-tazobactam (ZOSYN)  IV 3.375 g (01/17/19 0516)   PRN Meds:[MAR Hold] acetaminophen **OR** [MAR Hold] acetaminophen, [MAR Hold] albuterol, bupivacaine, [MAR Hold]  HYDROmorphone (DILAUDID) injection, [MAR Hold] ondansetron (ZOFRAN) IV  Assessment/Plan: Cellulitis/ Foreign body: Possible abscess -Podiatry surgery consulted and patient is to undergo irrigation and debridement of the left foot this afternoon Dilaudid 0.5mg  iv q4h prn  Zofran 4mg  iv q6h prn  Zosyn iv pharmacy to dose Appreciate podiatry assistance in managing this patient -Left foot soft tissue ultrasound per podiatry recommendation - Pt will need a few doses of iv Abx as per surgery indication before changing to PO - Appreciate Podiatry Surgery assistance   Asthma:  Albuterol HFA 2puff q6h prn   Prediabetes fsbs q4h, ISS  DVT Prophylaxis-   Lovenox - SCDs     LOS: 0 days   Beverly Huber

## 2019-01-17 NOTE — Interval H&P Note (Signed)
History and Physical Interval Note:  01/17/2019 4:20 PM  Beverly Huber  has presented today for surgery, with the diagnosis of FOREIGN BODY FOOT.  The various methods of treatment have been discussed with the patient and family. After consideration of risks, benefits and other options for treatment, the patient has consented to  Procedure(s): IRRIGATION AND DEBRIDEMENT FOOT, LEFT (Left) as a surgical intervention.  The patient's history has been reviewed, patient examined, no change in status, stable for surgery.  I have reviewed the patient's chart and labs.  Questions were answered to the patient's satisfaction.     [name]

## 2019-01-17 NOTE — Anesthesia Preprocedure Evaluation (Addendum)
Anesthesia Evaluation  Patient identified by MRN, date of birth, ID band Patient awake    Reviewed: Allergy & Precautions, H&P , NPO status , Patient's Chart, lab work & pertinent test results  Airway Mallampati: III  TM Distance: >3 FB Neck ROM: full    Dental  (+) Caps, Partial Upper   Pulmonary asthma ,           Cardiovascular negative cardio ROS       Neuro/Psych negative neurological ROS  negative psych ROS   GI/Hepatic negative GI ROS, Neg liver ROS,   Endo/Other  negative endocrine ROS  Renal/GU      Musculoskeletal   Abdominal   Peds  Hematology negative hematology ROS (+)   Anesthesia Other Findings Past Medical History: No date: Asthma No date: Prediabetes No date: Thyroid disease  History reviewed. No pertinent surgical history.  BMI    Body Mass Index: 36.62 kg/m      Reproductive/Obstetrics negative OB ROS                            Anesthesia Physical Anesthesia Plan  ASA: II  Anesthesia Plan:    Post-op Pain Management:    Induction:   PONV Risk Score and Plan: Dexamethasone, Ondansetron, Midazolam and Treatment may vary due to age or medical condition  Airway Management Planned:   Additional Equipment:   Intra-op Plan:   Post-operative Plan:   Informed Consent: I have reviewed the patients History and Physical, chart, labs and discussed the procedure including the risks, benefits and alternatives for the proposed anesthesia with the patient or authorized representative who has indicated his/her understanding and acceptance.     Dental Advisory Given  Plan Discussed with: Anesthesiologist  Anesthesia Plan Comments:         Anesthesia Quick Evaluation

## 2019-01-17 NOTE — Op Note (Addendum)
Date of operation: 01/17/2019.  Surgeon: Durward Fortes D.P.M.  Preoperative diagnosis: Foreign body left forefoot.  Postoperative diagnosis: Same with mild abscess.  Procedure: I&D foreign body and abscess left forefoot.  Anesthesia: Local MAC with sedation.  Hemostasis: Pneumatic tourniquet left ankle 250 mmHg.  Estimated blood loss: Less than 5 cc.  Cultures: Deep wound cultures left foot.  Complications: None apparent.  Operative indications: This is a 59 year old female who recently stepped on a toothpick.  States she was able to pull some of the toothpick out but continued to have some increased pain and swelling and presented to the emergency department where she was found to have some retained toothpick in her foot on ultrasound.  Decision was made for surgical I&D.  Operative procedure: Patient was taken to the operating room and placed on the table in the supine position.  Following satisfactory sedation the left foot was anesthetized with 10 cc of 0.5% Marcaine plain around the second metatarsal and forefoot area.  Pneumatic tourniquet was applied at the level of the left ankle and the foot was prepped and draped in the usual sterile fashion.      Attention was then directed to the plantar aspect of the left forefoot where the puncture wound was noted.  This was slightly opened up using a Beaver blade.  Cannot directly identify the foreign body and the incision was extended both distal and proximal to a total length of approximately 1.5 cm.  Some oozing was encountered and the foot was exsanguinated and the tourniquet inflated to 250 mmHg.  The wound was opened carefully using hemostats and the foreign body piece of toothpick was identified and removed in toto.  There was noted to be a small amount of purulence expressed from the base of the wound.  A culture was taken for sensitivities.  The wound was then thoroughly irrigated with a syringe with an olive tip irrigator.  The plantar  incision was then closed using 4-0 nylon simple interrupted sutures.  Xeroform 4 x 4's and con form applied to the left forefoot.  Tourniquet was released and blood flow noted to return immediately to the left foot and all digits.  Kerlix and Ace wrap then applied to the left lower extremity.  Patient tolerated procedure and anesthesia well and was awakened and transported to the PACU with vital signs stable and in good condition.     At this point I believe it would be prudent to keep the patient overnight for 1 or 2 more doses of her Zosyn but patient should be stable for discharge tomorrow on oral antibiotics with follow-up in 1 week outpatient

## 2019-01-18 DIAGNOSIS — M795 Residual foreign body in soft tissue: Secondary | ICD-10-CM | POA: Diagnosis not present

## 2019-01-18 DIAGNOSIS — L03116 Cellulitis of left lower limb: Secondary | ICD-10-CM | POA: Diagnosis not present

## 2019-01-18 LAB — GLUCOSE, CAPILLARY
Glucose-Capillary: 108 mg/dL — ABNORMAL HIGH (ref 70–99)
Glucose-Capillary: 121 mg/dL — ABNORMAL HIGH (ref 70–99)
Glucose-Capillary: 128 mg/dL — ABNORMAL HIGH (ref 70–99)

## 2019-01-18 MED ORDER — ACETAMINOPHEN 325 MG PO TABS: 650.0000 mg | ORAL_TABLET | Freq: Four times a day (QID) | ORAL | 0 refills | Status: AC | PRN

## 2019-01-18 MED ORDER — HYDROCODONE-ACETAMINOPHEN 5-325 MG PO TABS: 1.0000 | ORAL_TABLET | ORAL | 0 refills | Status: AC | PRN

## 2019-01-18 MED ORDER — CIPROFLOXACIN HCL 500 MG PO TABS: 500.0000 mg | ORAL_TABLET | Freq: Two times a day (BID) | ORAL | 0 refills | Status: AC

## 2019-01-18 MED ORDER — AMOXICILLIN-POT CLAVULANATE 875-125 MG PO TABS: 1.0000 | ORAL_TABLET | Freq: Two times a day (BID) | ORAL | 0 refills | Status: AC

## 2019-01-18 MED ORDER — ONDANSETRON 4 MG PO TBDP: 4.0000 mg | ORAL_TABLET | Freq: Three times a day (TID) | ORAL | 0 refills | Status: AC | PRN

## 2019-01-18 NOTE — Progress Notes (Signed)
01/18/2019 12:19 PM  Tyler Aas to be D/C'd Home per MD order.  Discussed prescriptions and follow up appointments with the patient. Prescriptions given to patient, medication list explained in detail. Pt verbalized understanding.  Allergies as of 01/18/2019   No Known Allergies     Medication List    STOP taking these medications   ibuprofen 600 MG tablet Commonly known as: ADVIL     TAKE these medications   acetaminophen 325 MG tablet Commonly known as: TYLENOL Take 2 tablets (650 mg total) by mouth every 6 (six) hours as needed for mild pain (or Fever >/= 101). Notes to patient: As needed   albuterol 108 (90 Base) MCG/ACT inhaler Commonly known as: VENTOLIN HFA Inhale 2 puffs into the lungs every 6 (six) hours as needed for wheezing or shortness of breath. Notes to patient: As needed   amoxicillin-clavulanate 875-125 MG tablet Commonly known as: Augmentin Take 1 tablet by mouth 2 (two) times daily for 10 days. Notes to patient: Evening 01/18/19   ciprofloxacin 500 MG tablet Commonly known as: Cipro Take 1 tablet (500 mg total) by mouth 2 (two) times daily for 10 days. Notes to patient: Evening 01/18/19   HYDROcodone-acetaminophen 5-325 MG tablet Commonly known as: NORCO/VICODIN Take 1 tablet by mouth every 4 (four) hours as needed for moderate pain. Notes to patient: As needed   levothyroxine 25 MCG tablet Commonly known as: SYNTHROID Take 25 mcg by mouth once a week. Take on Weds with tablet Notes to patient: 01/19/19   levothyroxine 50 MCG tablet Commonly known as: SYNTHROID Take 50 mcg by mouth every morning. Notes to patient: Morning 01/19/19   metFORMIN 500 MG tablet Commonly known as: GLUCOPHAGE Take 500 mg by mouth every morning. Notes to patient: Morning 01/19/19   ondansetron 4 MG disintegrating tablet Commonly known as: Zofran ODT Take 1 tablet (4 mg total) by mouth every 8 (eight) hours as needed for nausea or vomiting. Notes to patient: As  needed       Vitals:   01/17/19 2100 01/18/19 0420  BP: (!) 154/94 (!) 144/91  Pulse: 86 89  Resp: 18 18  Temp: 97.8 F (36.6 C) 99.3 F (37.4 C)  SpO2: 100% 99%    Skin clean, dry and intact without evidence of skin break down, no evidence of skin tears noted. IV catheter discontinued intact. Site without signs and symptoms of complications. Dressing and pressure applied. Pt denies pain at this time. No complaints noted.  An After Visit Summary was printed and given to the patient. Patient escorted via WC, and D/C home via private auto.  Bradly Chris

## 2019-01-18 NOTE — Discharge Summary (Signed)
Physician Discharge Summary  Patient ID: Beverly Huber MRN: [id number] DOB/AGE: [birth year] 59 y.o.  Admit date: 01/16/2019 Discharge date: 01/18/2019  Admission Diagnoses:  Discharge Diagnoses:  Principal Problem:   Cellulitis Active Problems:   Asthma   Prediabetes   Foreign body (FB) in soft tissue   Discharged Condition: fair  Hospital Course:  Beverly Huber  is a 59 y.o. female,  w hypothyroidism, prediabetes,  asthma, apparently presents with c/o stepping on ? Toothpick.  Pt presented to ED due to pain.  In Ed,  T 98.4, P 88 R 18, Bp 155/101 Pox 99% on RA Wt 95.3kg L foot xray; FINDINGS: No acute fracture or dislocation. No radiopaque foreign object. Suspect mild dorsal soft tissue swelling about the forefoot on the lateral view. Tiny Achilles spur. IMPRESSION: No acute osseous abnormality.  Cellulitis/ Foreign body: Significant improvement following surgery - Possible abscess found on surgery -Patient is status post irrigation and debridement of the left foot by podiatry surgery -Status post Zosyn iv pharmacy; changed to oral antibiotic for 7 more days per podiatry -Postsurgical care per podiatry: Patient was given instruction -Adequate pain management: Pain medication was also prescribed and sent to patient's pharmacy by the podiatry surgery -Patient was to follow-up with podiatry surgery in 1 week with Dr. Caryl Comes  Asthma:  Mainly stable Albuterol HFA 2puff q6h prn   Prediabetes: Stable fsbs q4h, ISS  Consults: Podiatry surgery  Significant Diagnostic Studies: Radiology imaging and blood work  Treatments: Per hospital course and discharge med list  Discharge Exam: Blood pressure (!) 144/91, pulse 89, temperature 99.3 F (37.4 C), temperature source Oral, resp. rate 18, height 5\' 4"  (1.626 m), weight 94.7 kg, SpO2 99 %.  1. General: axoxo3 3. Neurologic: nonfocal 4. HEENMT: Anicteric, pupils 1.41mm symmetric, direct, consensual intact Neck: no  jvd 5. Respiratory : CTAB 6. Cardiovascular : rrr s1, s2, no m/g/r 7. Gastrointestinal: ABd: soft, nt, nd, +bs 8. Skin: Post surgery dressing on.  Patient also has a boot on. 9.Musculoskeletal: Good ROM  Disposition: Discharge disposition: 01-Home or Self Care     Stable enough to be discharged home with 1 more week of oral antibiotic course and follow-up with podiatry surgery.  Warning signs and symptoms explained to patient when she must seek immediate medical attention.  Patient expressed understanding.  Discharge Instructions    Call MD for:  redness, tenderness, or signs of infection (pain, swelling, redness, odor or green/yellow discharge around incision site)   Complete by: As directed    Diet Carb Modified   Complete by: As directed    Increase activity slowly   Complete by: As directed      Allergies as of 01/18/2019   No Known Allergies     Medication List    STOP taking these medications   ibuprofen 600 MG tablet Commonly known as: ADVIL     TAKE these medications   acetaminophen 325 MG tablet Commonly known as: TYLENOL Take 2 tablets (650 mg total) by mouth every 6 (six) hours as needed for mild pain (or Fever >/= 101). Notes to patient: As needed   albuterol 108 (90 Base) MCG/ACT inhaler Commonly known as: VENTOLIN HFA Inhale 2 puffs into the lungs every 6 (six) hours as needed for wheezing or shortness of breath. Notes to patient: As needed   amoxicillin-clavulanate 875-125 MG tablet Commonly known as: Augmentin Take 1 tablet by mouth 2 (two) times daily for 10 days. Notes to patient: Evening 01/18/19   ciprofloxacin 500  MG tablet Commonly known as: Cipro Take 1 tablet (500 mg total) by mouth 2 (two) times daily for 10 days. Notes to patient: Evening 01/18/19   HYDROcodone-acetaminophen 5-325 MG tablet Commonly known as: NORCO/VICODIN Take 1 tablet by mouth every 4 (four) hours as needed for moderate pain. Notes to patient: As needed    levothyroxine 25 MCG tablet Commonly known as: SYNTHROID Take 25 mcg by mouth once a week. Take on Weds with tablet Notes to patient: 01/19/19   levothyroxine 50 MCG tablet Commonly known as: SYNTHROID Take 50 mcg by mouth every morning. Notes to patient: Morning 01/19/19   metFORMIN 500 MG tablet Commonly known as: GLUCOPHAGE Take 500 mg by mouth every morning. Notes to patient: Morning 01/19/19   ondansetron 4 MG disintegrating tablet Commonly known as: Zofran ODT Take 1 tablet (4 mg total) by mouth every 8 (eight) hours as needed for nausea or vomiting. Notes to patient: As needed      Follow-up Information    Linus Galas, DPM. Go on 01/21/2019.   Specialty: Podiatry Why: 9am appointment Contact information: [address] [place] [address] [phone]        Rayetta Humphrey, MD. Go on 01/25/2019.   Specialty: Family Medicine Why: 1:20pm appointment Contact information: [address] [place] [address] [phone]           Signed: Thomasenia Bottoms 01/18/2019, 2:08 PM

## 2019-01-18 NOTE — Progress Notes (Signed)
1 Day Post-Op   Subjective/Chief Complaint: Patient seen.  A little pain overnight but overall doing well other than a little nausea.   Objective: Vital signs in last 24 hours: Temp:  [97.2 F (36.2 C)-99.3 F (37.4 C)] 99.3 F (37.4 C) (01/05 0420) Pulse Rate:  [60-89] 89 (01/05 0420) Resp:  [17-20] 18 (01/05 0420) BP: (135-154)/(81-94) 144/91 (01/05 0420) SpO2:  [97 %-100 %] 99 % (01/05 0420) Weight:  [94.7 kg-96.5 kg] 94.7 kg (01/05 0500) Last BM Date: 01/16/19  Intake/Output from previous day: 01/04 0701 - 01/05 0700 In: 1110 [P.O.:960; IV Piggyback:150] Out: 2 [Urine:2] Intake/Output this shift: No intake/output data recorded.  Bandages dry and intact on the left foot.  Lab Results:  Recent Labs    01/16/19 2108 01/17/19 0500  WBC 8.0 5.7  HGB 11.8* 10.5*  HCT 40.0 35.5*  PLT 230 211   BMET Recent Labs    01/16/19 2108 01/17/19 0500  NA 140 138  K 3.6 3.7  CL 104 105  CO2 27 26  GLUCOSE 111* 109*  BUN 10 11  CREATININE 0.82 0.80  CALCIUM 9.4 8.7*   PT/INR No results for input(s): LABPROT, INR in the last 72 hours. ABG No results for input(s): PHART, HCO3 in the last 72 hours.  Invalid input(s): PCO2, PO2  Studies/Results: DG Foot Complete Left  Result Date: 01/16/2019 CLINICAL DATA:  Stepped on piece of wood last night. Possible splinter. Swelling. EXAM: LEFT FOOT - COMPLETE 3+ VIEW COMPARISON:  None. FINDINGS: No acute fracture or dislocation. No radiopaque foreign object. Suspect mild dorsal soft tissue swelling about the forefoot on the lateral view. Tiny Achilles spur. IMPRESSION: No acute osseous abnormality. Electronically Signed   By: Abigail Miyamoto M.D.   On: 01/16/2019 18:38   [place] LT LOWER EXTREM LTD SOFT TISSUE NON VASCULAR  Addendum Date: 01/17/2019   ADDENDUM REPORT: 01/17/2019 09:47 ADDENDUM: Correction to findings and impression FINDINGS: Ultrasound performed of the plantar foot area of concern along the second MTP joint. IMPRESSION:  Obliquely oriented linear area of shadowing in the plantar foot area of concern suspicious for retained foreign body. Electronically Signed   By: Jerilynn Mages.  Shick M.D.   On: 01/17/2019 09:47   Result Date: 01/17/2019 CLINICAL DATA:  Stepped on a toothpick, foreign body EXAM: ULTRASOUND LEFT LOWER EXTREMITY LIMITED TECHNIQUE: Ultrasound examination of the lower extremity soft tissues was performed in the area of clinical concern. COMPARISON:  01/16/2019 FINDINGS: Ultrasound performed of the dorsal foot in the area of concern along the second MTP joint. This demonstrates a superficial thin linear area of shadowing which is slightly oblique in orientation suspicious for a retained soft tissue foreign body. Mild surrounding edema. This roughly measures 2.5 cm in length. IMPRESSION: Obliquely oriented linear area of shadowing in the dorsal foot area of concern suspicious for a retained foreign body. Electronically Signed: By: Jerilynn Mages.  Shick M.D. On: 01/17/2019 08:47    Anti-infectives: Anti-infectives (From admission, onward)   Start     Dose/Rate Route Frequency Ordered Stop   01/18/19 0000  amoxicillin-clavulanate (AUGMENTIN) 875-125 MG tablet     1 tablet Oral 2 times daily 01/18/19 0815 01/28/19 2359   01/18/19 0000  ciprofloxacin (CIPRO) 500 MG tablet     500 mg Oral 2 times daily 01/18/19 0815 01/28/19 2359   01/17/19 0300  piperacillin-tazobactam (ZOSYN) IVPB 3.375 g     3.375 g 12.5 mL/hr over 240 Minutes Intravenous Every 8 hours 01/16/19 2230     01/16/19 2100  piperacillin-tazobactam (ZOSYN) IVPB 3.375 g     3.375 g 100 mL/hr over 30 Minutes Intravenous  Once 01/16/19 2058 01/16/19 2143      Assessment/Plan: s/p Procedure(s): IRRIGATION AND DEBRIDEMENT FOOT, LEFT (Left) Assessment: Stable status post I&D foreign body left foot.   Plan: Dressing left intact.  Plan for follow-up outpatient on Friday for dressing change and reevaluation.  Prescriptions sent in for Augmentin and Cipro to [facility] to be  initiated on discharge.  Also prescription for Norco sent in.  At this point patient is stable for discharge with plan for follow-up on Friday  LOS: 1 day    [name] 01/18/2019

## 2019-01-22 LAB — AEROBIC/ANAEROBIC CULTURE W GRAM STAIN (SURGICAL/DEEP WOUND)

## 2019-01-26 NOTE — Anesthesia Postprocedure Evaluation (Signed)
Anesthesia Post Note  Patient: Beverly Huber  Procedure(s) Performed: IRRIGATION AND DEBRIDEMENT FOOT, LEFT (Left Foot)  Patient location during evaluation: PACU Anesthesia Type: General Level of consciousness: awake and alert Pain management: pain level controlled Vital Signs Assessment: post-procedure vital signs reviewed and stable Respiratory status: spontaneous breathing, nonlabored ventilation, respiratory function stable and patient connected to nasal cannula oxygen Cardiovascular status: blood pressure returned to baseline and stable Postop Assessment: no apparent nausea or vomiting Anesthetic complications: no     Last Vitals:  Vitals:   01/17/19 2100 01/18/19 0420  BP: (!) 154/94 (!) 144/91  Pulse: 86 89  Resp: 18 18  Temp: 36.6 C 37.4 C  SpO2: 100% 99%    Last Pain:  Vitals:   01/18/19 0534  TempSrc:   PainSc: Asleep                 Yevette Edwards

## 2019-08-01 ENCOUNTER — Other Ambulatory Visit: Payer: Self-pay | Admitting: Family Medicine

## 2019-08-01 DIAGNOSIS — E041 Nontoxic single thyroid nodule: Secondary | ICD-10-CM

## 2019-08-09 ENCOUNTER — Ambulatory Visit: Payer: 59

## 2019-08-11 ENCOUNTER — Other Ambulatory Visit: Payer: Self-pay

## 2019-08-11 ENCOUNTER — Ambulatory Visit
Admission: RE | Admit: 2019-08-11 | Discharge: 2019-08-11 | Disposition: A | Discharge status: Home / Self Care | Payer: 59 | Source: Ambulatory Visit | Attending: Family Medicine | Admitting: Family Medicine

## 2019-08-11 DIAGNOSIS — E041 Nontoxic single thyroid nodule: Secondary | ICD-10-CM | POA: Diagnosis present

## 2020-07-02 ENCOUNTER — Other Ambulatory Visit: Payer: Self-pay | Admitting: Family Medicine

## 2020-07-02 DIAGNOSIS — Z1231 Encounter for screening mammogram for malignant neoplasm of breast: Secondary | ICD-10-CM

## 2021-03-21 IMAGING — US US EXTREM LOW*L* LIMITED
1 series · 7 of 7 positions shown · non-contrast
Comparison: 01/16/2019
COMPARISON: 01/16/2019

Addendum:
CLINICAL DATA: Stepped on a toothpick, foreign body

EXAM:
ULTRASOUND LEFT LOWER EXTREMITY LIMITED
TECHNIQUE: Ultrasound examination of the lower extremity soft tissues was
performed in the area of clinical concern.

[Series 2: us extrem low*left* limited · 7 of 7 slices shown]
[im 1/7]
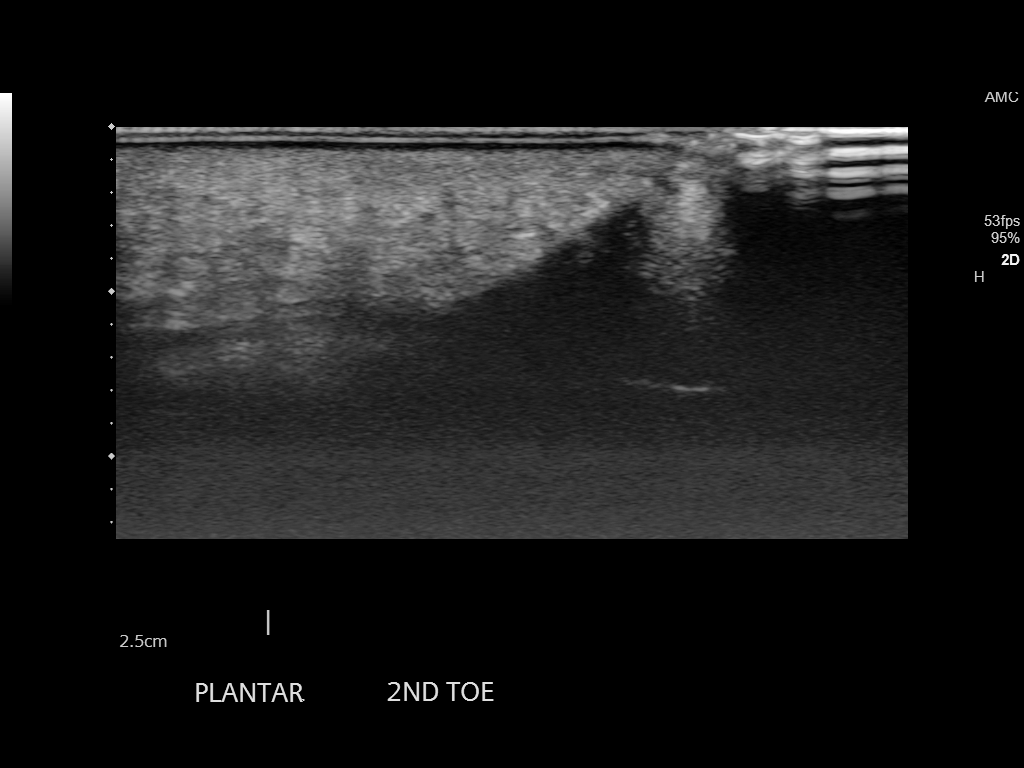
[im 2/7]
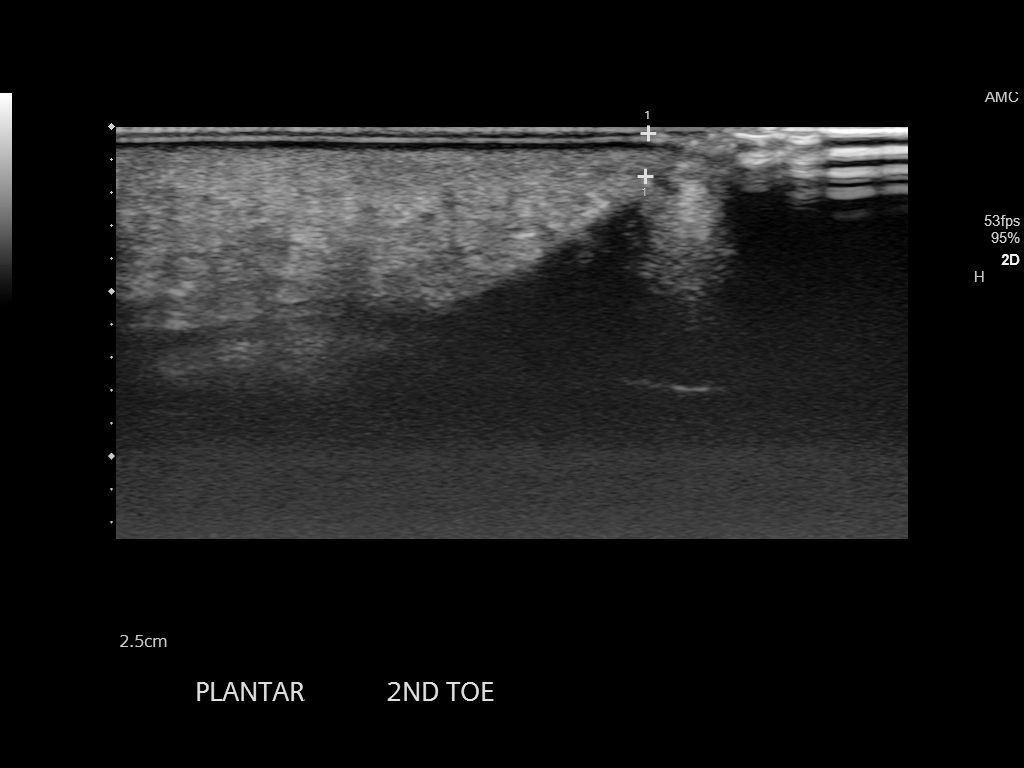
[im 3/7]
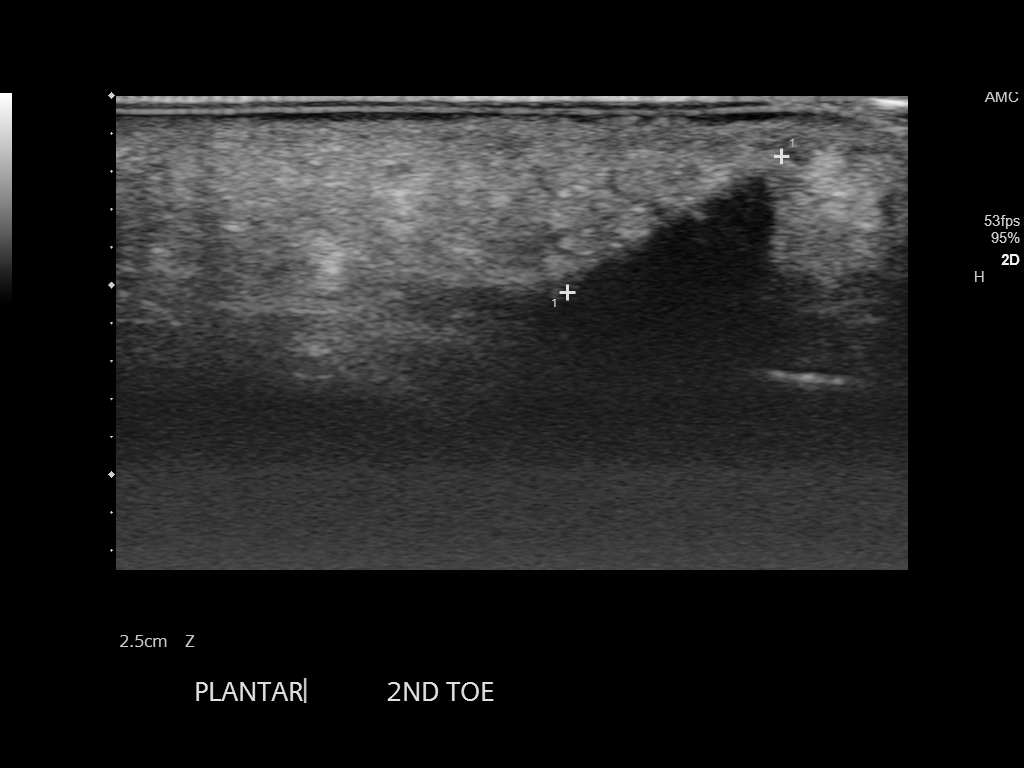
[im 4/7]
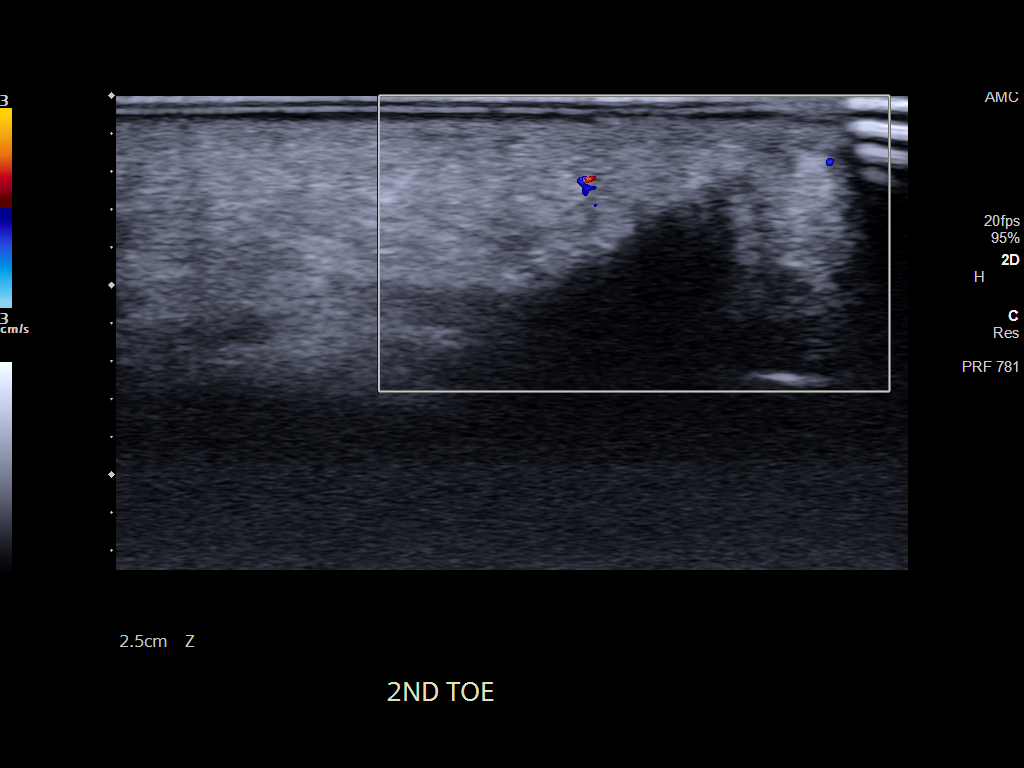
[im 5/7]
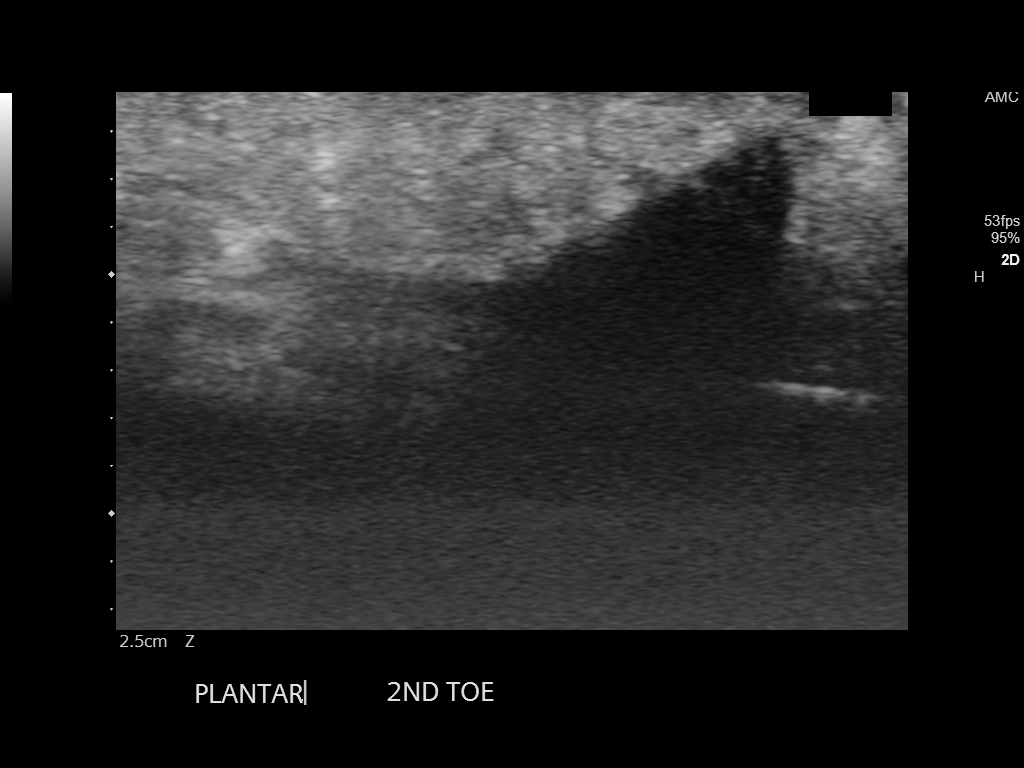
[im 6/7]
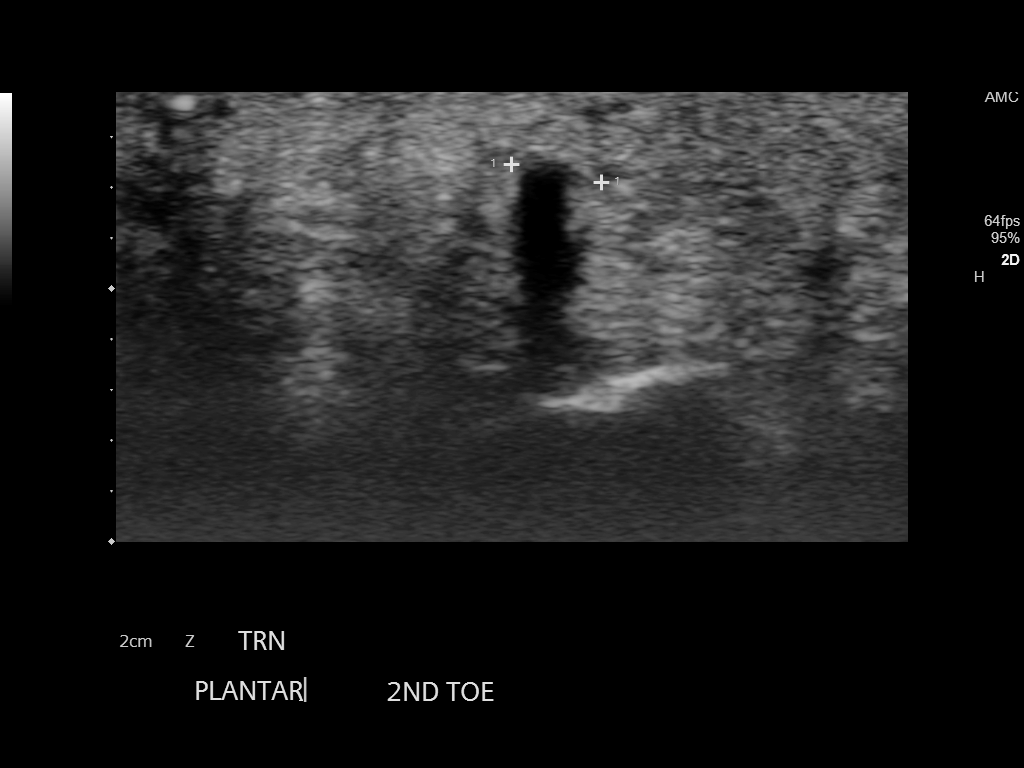
[im 7/7]
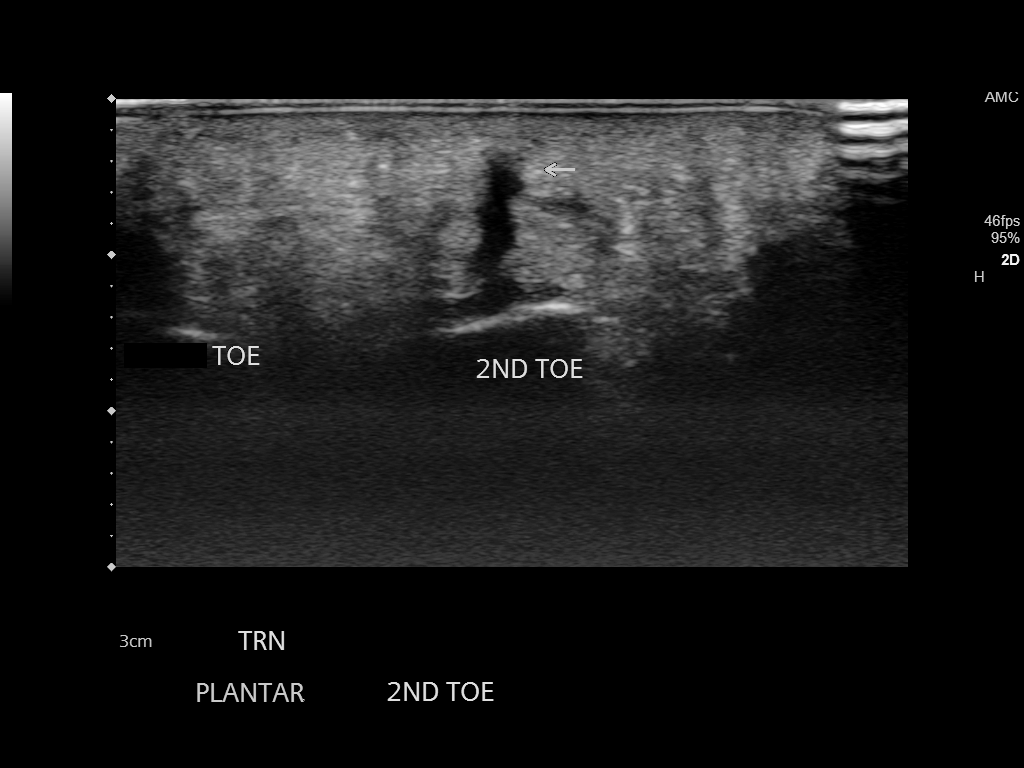

[7 of 7 positions shown; findings below may reference images not displayed]

FINDINGS: Ultrasound performed of the dorsal foot in the area of concern along
the second MTP joint. This demonstrates a superficial thin linear
area of shadowing which is slightly oblique in orientation
suspicious for a retained soft tissue foreign body. Mild surrounding
edema. This roughly measures 2.5 cm in length.
IMPRESSION: Obliquely oriented linear area of shadowing in the dorsal foot area
of concern suspicious for a retained foreign body.

ADDENDUM:
Correction to findings and impression
FINDINGS: Ultrasound performed of the plantar foot area of concern along the
second MTP joint.
IMPRESSION: Obliquely oriented linear area of shadowing in the plantar foot area
of concern suspicious for retained foreign body.

*** End of Addendum ***
FINDINGS: Ultrasound performed of the dorsal foot in the area of concern along
the second MTP joint. This demonstrates a superficial thin linear
area of shadowing which is slightly oblique in orientation
suspicious for a retained soft tissue foreign body. Mild surrounding
edema. This roughly measures 2.5 cm in length.
IMPRESSION: Obliquely oriented linear area of shadowing in the dorsal foot area
of concern suspicious for a retained foreign body.

## 2021-10-13 IMAGING — US US THYROID
1 series · 13 of 25 positions shown · non-contrast
Comparison: 06/22/2017

CLINICAL DATA: Left inferior thyroid nodule

EXAM:
THYROID ULTRASOUND
TECHNIQUE: Ultrasound examination of the thyroid gland and adjacent soft
tissues was performed.

[Series 1: us thyroid · 0.06mm/px · 13 of 68 slices shown]
[im 1/68]
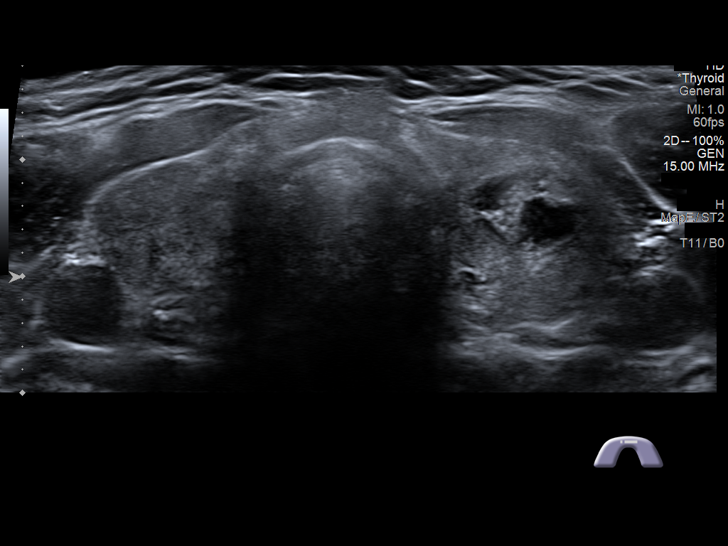
[im 6/68]
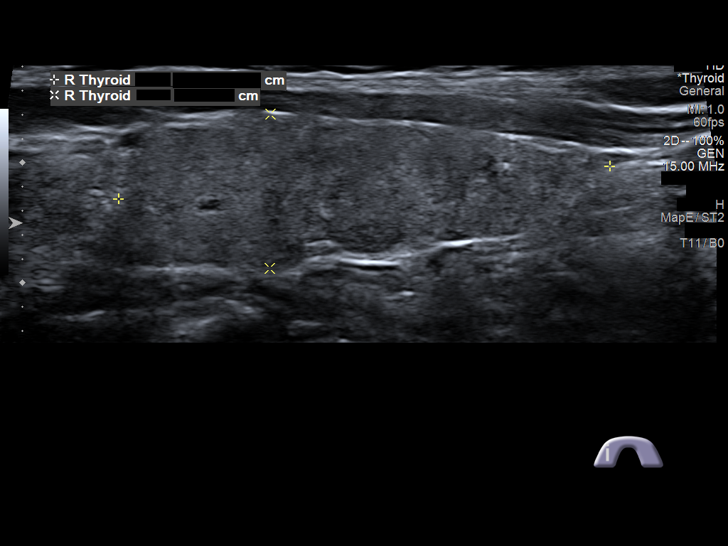
[im 12/68]
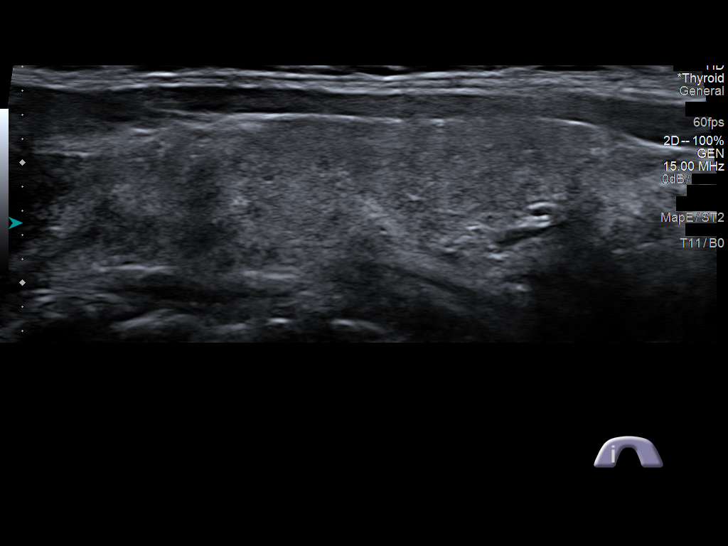
[im 17/68]
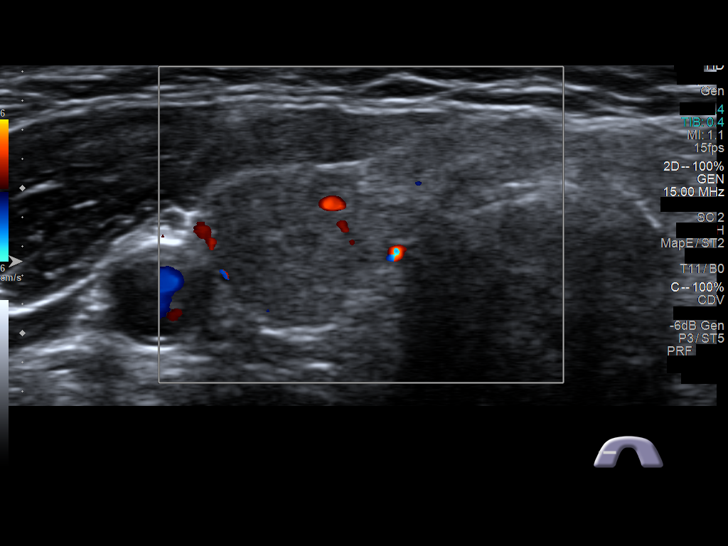
[im 23/68]
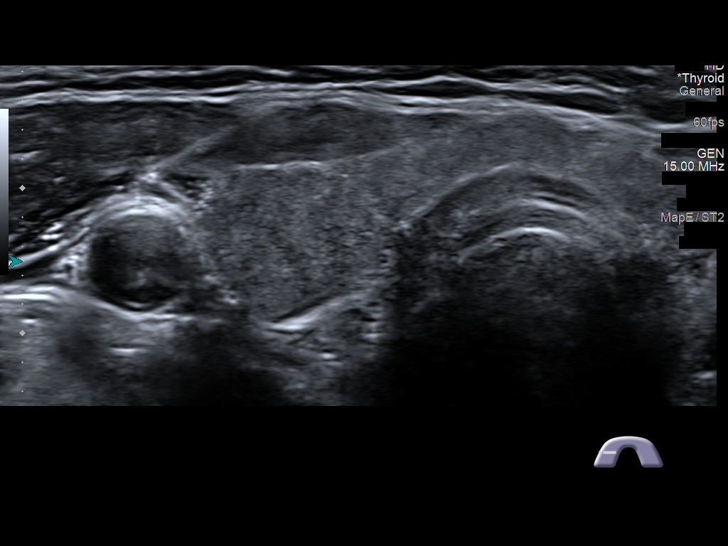
[im 28/68]
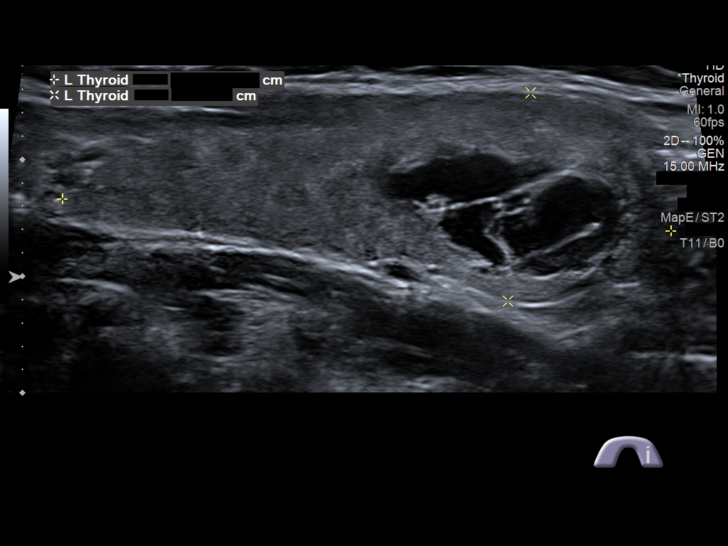
[im 34/68]
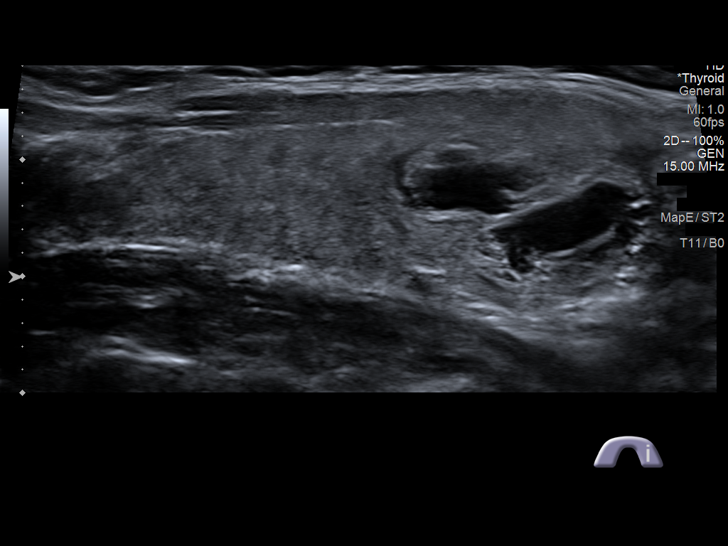
[im 40/68]
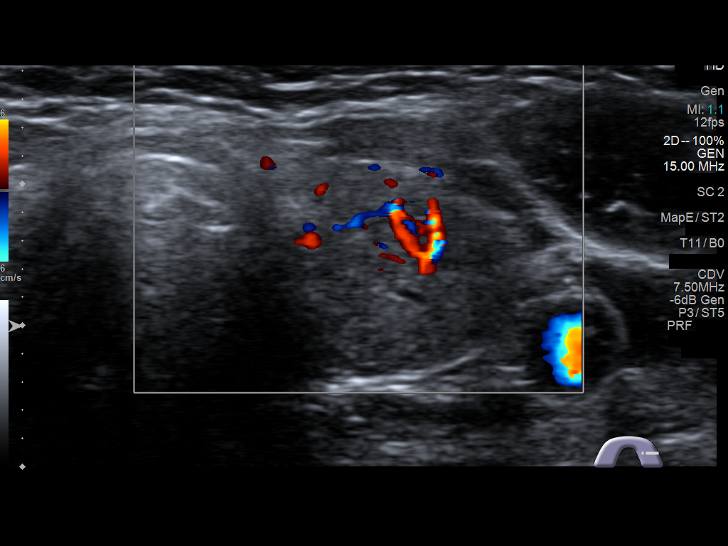
[im 45/68]
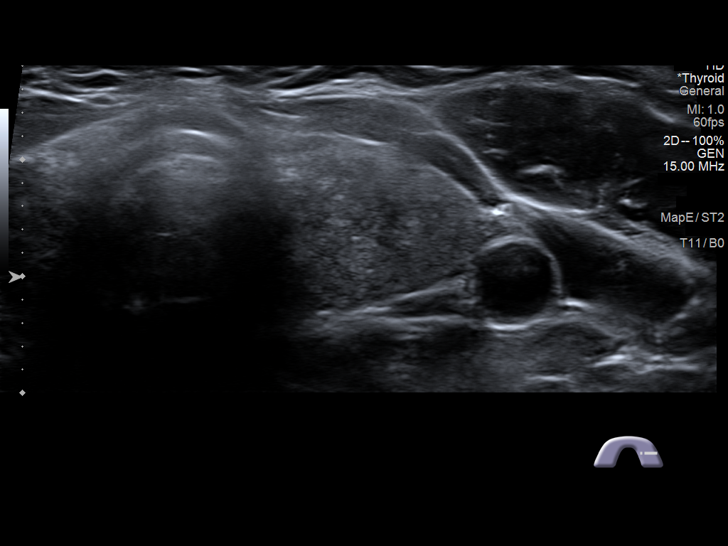
[im 51/68]
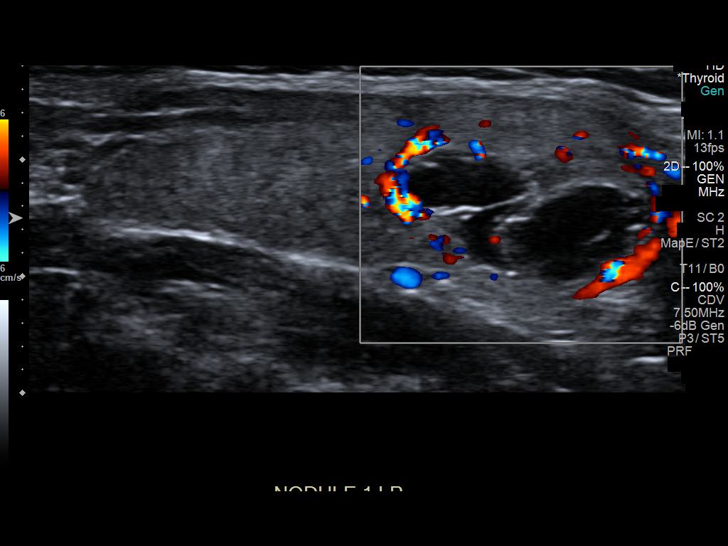
[im 56/68]
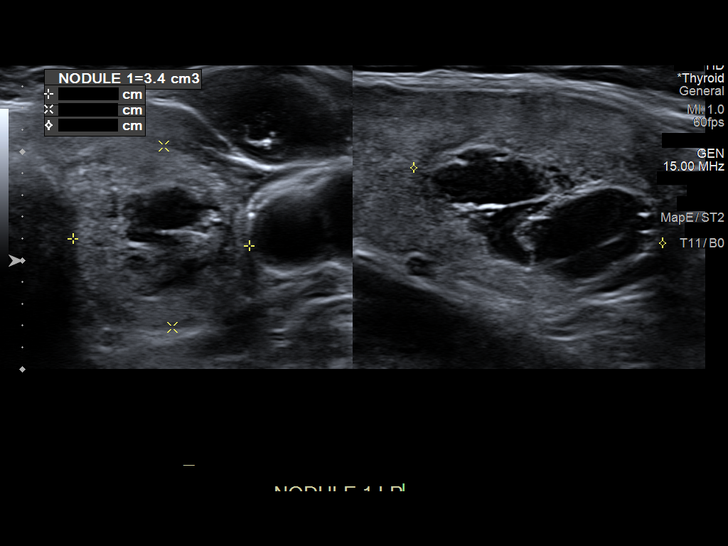
[im 62/68]
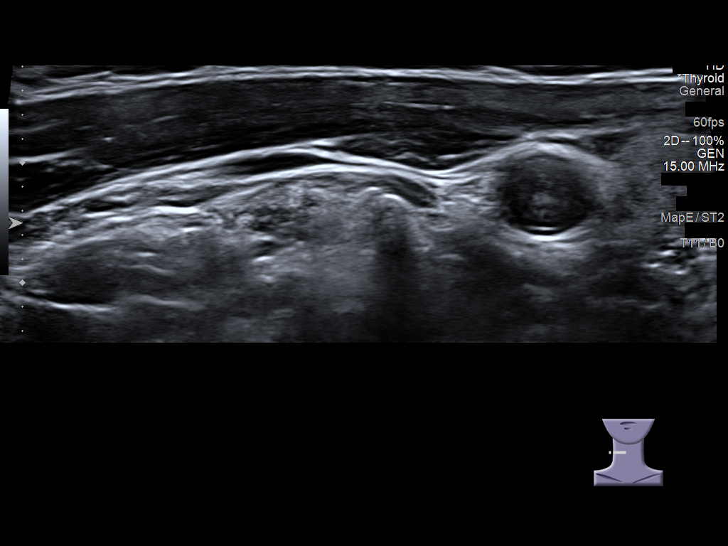
[im 68/68]
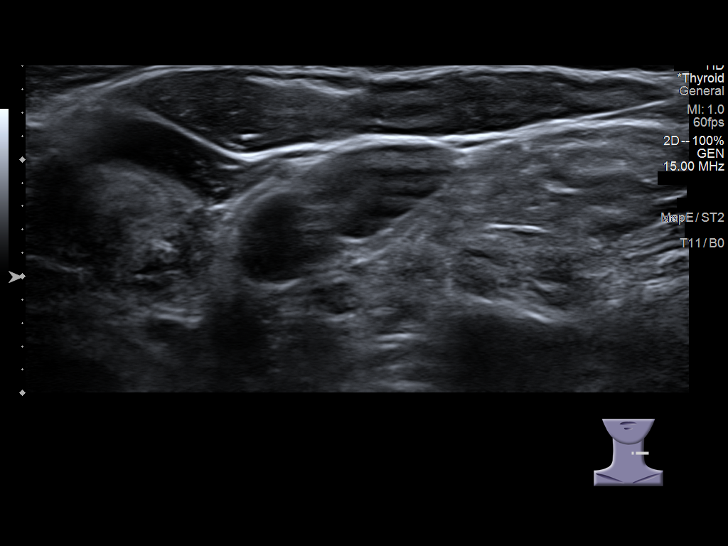

[13 of 25 positions shown; findings below may reference images not displayed]

FINDINGS: Parenchymal Echotexture: Moderately heterogenous

Isthmus: 3 mm

Right lobe: 4.1 x 1.3 x 1.4 cm

Left lobe: 5.2 x 1.8 x 1.6 cm

_________________________________________________________

Estimated total number of nodules >/= 1 cm: 1

Number of spongiform nodules >/=  2 cm not described below (TR1): 0

Number of mixed cystic and solid nodules >/= 1.5 cm not described
below (TR2): 0

_________________________________________________________

Nodule # 1:

Location: Left; Inferior

Maximum size: 2.4, previously 2.0 cm; Other 2 dimensions: 1.7 x
cm

Composition: mixed cystic and solid (1)

Echogenicity: isoechoic (1)

Shape: not taller-than-wide (0)

Margins: ill-defined (0)

Echogenic foci: none (0)

ACR TI-RADS total points: 2.

ACR TI-RADS risk category: TR2 (2 points).

ACR TI-RADS recommendations:

This nodule does NOT meet TI-RADS criteria for biopsy or dedicated
follow-up. This nodule is more cystic and therefore down graded from
a previous TR 3 nodule to a TR 2 nodule.

_________________________________________________________

No new thyroid abnormality or additional nodule. Stable gland
heterogeneity. Normal vascularity without regional adenopathy.
IMPRESSION: 2.4 cm left inferior TR 2 nodule, down graded from previous TR 3
status. Currently no further follow-up or biopsy is indicated.

Stable nonspecific thyroid heterogeneity.

No new finding.

The above is in keeping with the ACR TI-RADS recommendations - [HOSPITAL] 8550;[DATE].

## 2022-01-22 ENCOUNTER — Other Ambulatory Visit: Payer: Self-pay | Admitting: Physical Medicine & Rehabilitation

## 2022-01-22 DIAGNOSIS — G8929 Other chronic pain: Secondary | ICD-10-CM

## 2022-01-29 ENCOUNTER — Ambulatory Visit
Admission: RE | Admit: 2022-01-29 | Discharge: 2022-01-29 | Disposition: A | Discharge status: Home / Self Care | Payer: 59 | Source: Ambulatory Visit | Attending: Physical Medicine & Rehabilitation | Admitting: Physical Medicine & Rehabilitation

## 2022-01-29 DIAGNOSIS — G8929 Other chronic pain: Secondary | ICD-10-CM

## 2022-01-29 DIAGNOSIS — M5442 Lumbago with sciatica, left side: Secondary | ICD-10-CM | POA: Insufficient documentation

## 2022-08-04 ENCOUNTER — Other Ambulatory Visit: Payer: Self-pay | Admitting: Family Medicine

## 2022-08-04 DIAGNOSIS — N950 Postmenopausal bleeding: Secondary | ICD-10-CM
